# Patient Record
Sex: Male | Born: 1955
Health system: Southern US, Community
[De-identification: ages and names within clinical notes are randomized; demographics above are authoritative.]

## PROBLEM LIST (undated history)

## (undated) DIAGNOSIS — I10 Essential (primary) hypertension: Secondary | ICD-10-CM

## (undated) DIAGNOSIS — Z974 Presence of external hearing-aid: Secondary | ICD-10-CM

## (undated) DIAGNOSIS — C8590 Non-Hodgkin lymphoma, unspecified, unspecified site: Secondary | ICD-10-CM

## (undated) DIAGNOSIS — C801 Malignant (primary) neoplasm, unspecified: Secondary | ICD-10-CM

## (undated) DIAGNOSIS — K219 Gastro-esophageal reflux disease without esophagitis: Secondary | ICD-10-CM

## (undated) DIAGNOSIS — Z87442 Personal history of urinary calculi: Secondary | ICD-10-CM

## (undated) DIAGNOSIS — R7303 Prediabetes: Secondary | ICD-10-CM

## (undated) DIAGNOSIS — G5601 Carpal tunnel syndrome, right upper limb: Secondary | ICD-10-CM

## (undated) DIAGNOSIS — J189 Pneumonia, unspecified organism: Secondary | ICD-10-CM

## (undated) DIAGNOSIS — M1990 Unspecified osteoarthritis, unspecified site: Secondary | ICD-10-CM

## (undated) HISTORY — PX: KNEE ARTHROSCOPY: SUR90

## (undated) HISTORY — PX: EXTERNAL EAR SURGERY: SHX627

## (undated) HISTORY — PX: TESTICLE SURGERY: SHX794

---

## 2007-02-09 ENCOUNTER — Ambulatory Visit: Payer: Self-pay | Admitting: Family Medicine

## 2007-02-14 ENCOUNTER — Ambulatory Visit: Payer: Self-pay | Admitting: Family Medicine

## 2008-01-02 ENCOUNTER — Ambulatory Visit: Payer: Self-pay | Admitting: Family Medicine

## 2011-06-23 ENCOUNTER — Ambulatory Visit: Payer: Self-pay | Admitting: Internal Medicine

## 2011-06-23 DIAGNOSIS — C8590 Non-Hodgkin lymphoma, unspecified, unspecified site: Secondary | ICD-10-CM

## 2011-06-23 HISTORY — DX: Non-Hodgkin lymphoma, unspecified, unspecified site: C85.90

## 2011-07-08 ENCOUNTER — Ambulatory Visit: Payer: Self-pay | Admitting: Urology

## 2011-07-10 LAB — PATHOLOGY REPORT

## 2011-07-22 ENCOUNTER — Ambulatory Visit: Payer: Self-pay | Admitting: Internal Medicine

## 2011-07-24 ENCOUNTER — Ambulatory Visit: Payer: Self-pay | Admitting: Internal Medicine

## 2011-07-29 LAB — RETICULOCYTES: Absolute Retic Count: 0.146 10*6/uL — ABNORMAL HIGH (ref 0.024–0.084)

## 2011-07-29 LAB — CBC CANCER CENTER
Basophil #: 0 x10 3/mm (ref 0.0–0.1)
Basophil %: 0.3 %
Basophil: 1 %
Eosinophil #: 0.1 x10 3/mm (ref 0.0–0.7)
Eosinophil: 1 %
HCT: 45.7 % (ref 40.0–52.0)
HGB: 15.9 g/dL (ref 13.0–18.0)
Lymphocyte #: 1.4 x10 3/mm (ref 1.0–3.6)
Lymphocyte %: 17.7 %
Lymphocytes: 16 %
MCHC: 34.9 g/dL (ref 32.0–36.0)
MCV: 84 fL (ref 80–100)
Monocyte #: 0.5 x10 3/mm (ref 0.0–0.7)
Monocyte %: 6.4 %
Monocytes: 5 %
Neutrophil #: 5.7 x10 3/mm (ref 1.4–6.5)
Platelet: 252 x10 3/mm (ref 150–440)
RDW: 14.3 % (ref 11.5–14.5)

## 2011-07-29 LAB — COMPREHENSIVE METABOLIC PANEL
Alkaline Phosphatase: 112 U/L (ref 50–136)
Anion Gap: 7 (ref 7–16)
BUN: 15 mg/dL (ref 7–18)
Calcium, Total: 9 mg/dL (ref 8.5–10.1)
Chloride: 101 mmol/L (ref 98–107)
Co2: 31 mmol/L (ref 21–32)
Creatinine: 1.11 mg/dL (ref 0.60–1.30)
EGFR (African American): >60
EGFR (Non-African Amer.): >60
Glucose: 103 mg/dL — ABNORMAL HIGH (ref 65–99)
Potassium: 4 mmol/L (ref 3.5–5.1)
SGOT(AST): 31 U/L (ref 15–37)
SGPT (ALT): 57 U/L
Total Protein: 8 g/dL (ref 6.4–8.2)

## 2011-07-29 LAB — LACTATE DEHYDROGENASE: LDH: 193 U/L (ref 87–241)

## 2011-07-30 ENCOUNTER — Ambulatory Visit: Payer: Self-pay | Admitting: Internal Medicine

## 2011-07-30 ENCOUNTER — Ambulatory Visit: Payer: Self-pay

## 2011-08-03 ENCOUNTER — Ambulatory Visit: Payer: Self-pay

## 2011-08-10 ENCOUNTER — Ambulatory Visit: Payer: Self-pay | Admitting: Internal Medicine

## 2011-08-10 LAB — CSF CELL COUNT WITH DIFFERENTIAL
CSF Tube #: 3
Eosinophil: 0 %
Lymphocytes: 60 %
Monocytes/Macrophages: 0 %
Neutrophils: 40 %
RBC (CSF): 85 /mm3
WBC (CSF): 2 /mm3

## 2011-08-10 LAB — GLUCOSE, CSF: Glucose, CSF: 71 mg/dL (ref 40–75)

## 2011-08-13 LAB — BASIC METABOLIC PANEL
Anion Gap: 6 — ABNORMAL LOW (ref 7–16)
Chloride: 102 mmol/L (ref 98–107)
Co2: 32 mmol/L (ref 21–32)
Creatinine: 1.12 mg/dL (ref 0.60–1.30)
EGFR (African American): >60
EGFR (Non-African Amer.): >60
Glucose: 134 mg/dL — ABNORMAL HIGH (ref 65–99)
Osmolality: 282 (ref 275–301)
Potassium: 3.9 mmol/L (ref 3.5–5.1)
Sodium: 140 mmol/L (ref 136–145)

## 2011-08-13 LAB — CBC CANCER CENTER
Basophil #: 0 x10 3/mm (ref 0.0–0.1)
Eosinophil #: 0.1 x10 3/mm (ref 0.0–0.7)
Eosinophil %: 2.2 %
HGB: 15.1 g/dL (ref 13.0–18.0)
Lymphocyte #: 1 x10 3/mm (ref 1.0–3.6)
Lymphocyte %: 19.4 %
MCH: 28 pg (ref 26.0–34.0)
MCHC: 33.5 g/dL (ref 32.0–36.0)
Monocyte #: 0.4 x10 3/mm (ref 0.0–0.7)
Neutrophil %: 70.1 %
Platelet: 177 x10 3/mm (ref 150–440)
RBC: 5.38 10*6/uL (ref 4.40–5.90)
RDW: 13.2 % (ref 11.5–14.5)

## 2011-08-20 LAB — CBC CANCER CENTER
Basophil #: 0 x10 3/mm (ref 0.0–0.1)
Basophil %: 0.6 %
Eosinophil #: 0.1 x10 3/mm (ref 0.0–0.7)
HCT: 43.7 % (ref 40.0–52.0)
HGB: 14.4 g/dL (ref 13.0–18.0)
Lymphocyte #: 0.6 x10 3/mm — ABNORMAL LOW (ref 1.0–3.6)
MCH: 27.7 pg (ref 26.0–34.0)
MCHC: 32.9 g/dL (ref 32.0–36.0)
Monocyte #: 0.2 x10 3/mm (ref 0.0–0.7)
Neutrophil %: 63.4 %
Platelet: 203 x10 3/mm (ref 150–440)
WBC: 2.5 x10 3/mm — ABNORMAL LOW (ref 3.8–10.6)

## 2011-08-21 ENCOUNTER — Ambulatory Visit: Payer: Self-pay | Admitting: Internal Medicine

## 2011-08-27 LAB — URINALYSIS, COMPLETE
Bacteria: NEGATIVE
Glucose,UR: NEGATIVE mg/dL (ref 0–75)
Ketone: NEGATIVE
Nitrite: NEGATIVE
Ph: 8 (ref 4.5–8.0)
Specific Gravity: 1.01 (ref 1.003–1.030)
Squamous Epithelial: NONE SEEN
WBC UR: NONE SEEN /HPF (ref 0–5)

## 2011-08-27 LAB — CBC CANCER CENTER
Basophil %: 0.5 %
Eosinophil #: 0 x10 3/mm (ref 0.0–0.7)
Eosinophil %: 0.2 %
HGB: 14.9 g/dL (ref 13.0–18.0)
Lymphocyte #: 1.2 x10 3/mm (ref 1.0–3.6)
Monocyte #: 0.9 x10 3/mm — ABNORMAL HIGH (ref 0.0–0.7)
Neutrophil #: 10.9 x10 3/mm — ABNORMAL HIGH (ref 1.4–6.5)
Neutrophil %: 83.3 %
RBC: 5.18 10*6/uL (ref 4.40–5.90)
WBC: 13.1 x10 3/mm — ABNORMAL HIGH (ref 3.8–10.6)

## 2011-08-27 LAB — COMPREHENSIVE METABOLIC PANEL
Alkaline Phosphatase: 139 U/L — ABNORMAL HIGH (ref 50–136)
Anion Gap: 5 — ABNORMAL LOW (ref 7–16)
BUN: 15 mg/dL (ref 7–18)
Bilirubin,Total: 0.4 mg/dL (ref 0.2–1.0)
Chloride: 100 mmol/L (ref 98–107)
Co2: 34 mmol/L — ABNORMAL HIGH (ref 21–32)
Creatinine: 1.09 mg/dL (ref 0.60–1.30)
EGFR (African American): >60
EGFR (Non-African Amer.): >60
Potassium: 4 mmol/L (ref 3.5–5.1)
SGPT (ALT): 59 U/L
Total Protein: 8 g/dL (ref 6.4–8.2)

## 2011-09-03 LAB — CBC CANCER CENTER
Basophil #: 0 x10 3/mm (ref 0.0–0.1)
Basophil %: 0.3 %
HCT: 42.1 % (ref 40.0–52.0)
HGB: 14.3 g/dL (ref 13.0–18.0)
Lymphocyte %: 10.9 %
MCH: 28.5 pg (ref 26.0–34.0)
Monocyte #: 0.6 x10 3/mm (ref 0.0–0.7)
Monocyte %: 7.2 %
Neutrophil #: 7.2 x10 3/mm — ABNORMAL HIGH (ref 1.4–6.5)
Neutrophil %: 81.5 %
RBC: 5.01 10*6/uL (ref 4.40–5.90)
RDW: 14.3 % (ref 11.5–14.5)
WBC: 8.8 x10 3/mm (ref 3.8–10.6)

## 2011-09-10 LAB — CBC CANCER CENTER
Basophil #: 0 x10 3/mm (ref 0.0–0.1)
Basophil %: 0.4 %
Eosinophil %: 0.5 %
HCT: 39.6 % — ABNORMAL LOW (ref 40.0–52.0)
HGB: 13.5 g/dL (ref 13.0–18.0)
Lymphocyte #: 0.5 x10 3/mm — ABNORMAL LOW (ref 1.0–3.6)
MCH: 28.1 pg (ref 26.0–34.0)
MCV: 82.3 fL (ref 80–100)
Neutrophil #: 4.2 x10 3/mm (ref 1.4–6.5)
Platelet: 192 x10 3/mm (ref 150–440)
RBC: 4.82 10*6/uL (ref 4.40–5.90)
WBC: 4.8 x10 3/mm (ref 3.8–10.6)

## 2011-09-17 ENCOUNTER — Inpatient Hospital Stay: Payer: Self-pay | Admitting: Internal Medicine

## 2011-09-17 LAB — CBC CANCER CENTER
Basophil #: 0.1 x10 3/mm (ref 0.0–0.1)
Eosinophil #: 0.1 x10 3/mm (ref 0.0–0.7)
Eosinophil %: 0.4 %
HCT: 37.4 % — ABNORMAL LOW (ref 40.0–52.0)
HGB: 13 g/dL (ref 13.0–18.0)
Lymphocyte #: 1.2 x10 3/mm (ref 1.0–3.6)
Lymphocyte %: 9.2 %
MCV: 83.8 fL (ref 80–100)
Monocyte #: 1 x10 3/mm — ABNORMAL HIGH (ref 0.0–0.7)
Monocyte %: 7.9 %
Neutrophil %: 82.1 %
RBC: 4.47 10*6/uL (ref 4.40–5.90)
RDW: 15.5 % — ABNORMAL HIGH (ref 11.5–14.5)
WBC: 12.7 x10 3/mm — ABNORMAL HIGH (ref 3.8–10.6)

## 2011-09-17 LAB — COMPREHENSIVE METABOLIC PANEL
Anion Gap: 6 — ABNORMAL LOW (ref 7–16)
BUN: 16 mg/dL (ref 7–18)
Bilirubin,Total: 0.5 mg/dL (ref 0.2–1.0)
Calcium, Total: 8.8 mg/dL (ref 8.5–10.1)
Co2: 32 mmol/L (ref 21–32)
Creatinine: 1 mg/dL (ref 0.60–1.30)
Glucose: 119 mg/dL — ABNORMAL HIGH (ref 65–99)
Osmolality: 284 (ref 275–301)
Potassium: 4.2 mmol/L (ref 3.5–5.1)
SGOT(AST): 26 U/L (ref 15–37)
SGPT (ALT): 43 U/L
Total Protein: 7.1 g/dL (ref 6.4–8.2)

## 2011-09-17 LAB — URINALYSIS, COMPLETE
Bacteria: NEGATIVE
Bilirubin,UR: NEGATIVE
Glucose,UR: NEGATIVE mg/dL (ref 0–75)
Ketone: NEGATIVE
Protein: NEGATIVE
RBC,UR: NONE SEEN /HPF (ref 0–5)

## 2011-09-18 LAB — CBC WITH DIFFERENTIAL/PLATELET
Basophil #: 0 10*3/uL (ref 0.0–0.1)
Basophil %: 0.2 %
Eosinophil #: 0 10*3/uL (ref 0.0–0.7)
Eosinophil %: 0.4 %
HCT: 35.6 % — ABNORMAL LOW (ref 40.0–52.0)
HGB: 12.1 g/dL — ABNORMAL LOW (ref 13.0–18.0)
Lymphocyte %: 8.5 %
MCHC: 34.1 g/dL (ref 32.0–36.0)
MCV: 85 fL (ref 80–100)
Monocyte #: 0.7 10*3/uL (ref 0.0–0.7)
Neutrophil #: 8.1 10*3/uL — ABNORMAL HIGH (ref 1.4–6.5)
Neutrophil %: 83.8 %
Platelet: 125 10*3/uL — ABNORMAL LOW (ref 150–440)
RBC: 4.21 10*6/uL — ABNORMAL LOW (ref 4.40–5.90)
RDW: 16.6 % — ABNORMAL HIGH (ref 11.5–14.5)
WBC: 9.6 10*3/uL (ref 3.8–10.6)

## 2011-09-18 LAB — URINE PH
Ph: 7 (ref 4.5–8.0)
Ph: 7 (ref 4.5–8.0)

## 2011-09-18 LAB — BASIC METABOLIC PANEL
Anion Gap: 9 (ref 7–16)
EGFR (Non-African Amer.): >60
Glucose: 128 mg/dL — ABNORMAL HIGH (ref 65–99)
Potassium: 4.3 mmol/L (ref 3.5–5.1)
Sodium: 141 mmol/L (ref 136–145)

## 2011-09-19 LAB — BASIC METABOLIC PANEL
BUN: 23 mg/dL — ABNORMAL HIGH (ref 7–18)
Creatinine: 1.3 mg/dL (ref 0.60–1.30)
EGFR (African American): >60
EGFR (Non-African Amer.): >60
Glucose: 178 mg/dL — ABNORMAL HIGH (ref 65–99)
Osmolality: 288 (ref 275–301)
Potassium: 4 mmol/L (ref 3.5–5.1)
Sodium: 140 mmol/L (ref 136–145)

## 2011-09-19 LAB — URINE PH
Ph: 8 (ref 4.5–8.0)
Ph: 8 (ref 4.5–8.0)
Ph: 8 (ref 4.5–8.0)
Ph: 8 (ref 4.5–8.0)
Ph: 7 (ref 4.5–8.0)

## 2011-09-20 LAB — CBC WITH DIFFERENTIAL/PLATELET
Basophil #: 0 10*3/uL (ref 0.0–0.1)
Eosinophil #: 0 10*3/uL (ref 0.0–0.7)
HGB: 11.1 g/dL — ABNORMAL LOW (ref 13.0–18.0)
Lymphocyte #: 0.6 10*3/uL — ABNORMAL LOW (ref 1.0–3.6)
Lymphocyte %: 6.7 %
MCHC: 34.2 g/dL (ref 32.0–36.0)
MCV: 85 fL (ref 80–100)
Monocyte #: 0.8 10*3/uL — ABNORMAL HIGH (ref 0.0–0.7)
Monocyte %: 9 %
Neutrophil #: 7.5 10*3/uL — ABNORMAL HIGH (ref 1.4–6.5)
Neutrophil %: 84.2 %
RBC: 3.84 10*6/uL — ABNORMAL LOW (ref 4.40–5.90)
RDW: 16.4 % — ABNORMAL HIGH (ref 11.5–14.5)
WBC: 8.9 10*3/uL (ref 3.8–10.6)

## 2011-09-20 LAB — URINE PH
Ph: 7 (ref 4.5–8.0)
Ph: 8 (ref 4.5–8.0)
Ph: 9 (ref 4.5–8.0)
Ph: 9 (ref 4.5–8.0)
Ph: 9 (ref 4.5–8.0)
Ph: 8 (ref 4.5–8.0)
Ph: 8 (ref 4.5–8.0)

## 2011-09-20 LAB — BASIC METABOLIC PANEL
Anion Gap: 8 (ref 7–16)
BUN: 29 mg/dL — ABNORMAL HIGH (ref 7–18)
Calcium, Total: 7.6 mg/dL — ABNORMAL LOW (ref 8.5–10.1)
Co2: 33 mmol/L — ABNORMAL HIGH (ref 21–32)
Creatinine: 2.08 mg/dL — ABNORMAL HIGH (ref 0.60–1.30)
EGFR (Non-African Amer.): 35 — ABNORMAL LOW
Glucose: 119 mg/dL — ABNORMAL HIGH (ref 65–99)
Potassium: 4.1 mmol/L (ref 3.5–5.1)

## 2011-09-21 ENCOUNTER — Ambulatory Visit: Payer: Self-pay | Admitting: Internal Medicine

## 2011-09-21 LAB — BASIC METABOLIC PANEL
Anion Gap: 8 (ref 7–16)
BUN: 31 mg/dL — ABNORMAL HIGH (ref 7–18)
Calcium, Total: 7.2 mg/dL — ABNORMAL LOW (ref 8.5–10.1)
Chloride: 100 mmol/L (ref 98–107)
EGFR (Non-African Amer.): 21 — ABNORMAL LOW
Glucose: 135 mg/dL — ABNORMAL HIGH (ref 65–99)
Potassium: 4 mmol/L (ref 3.5–5.1)
Sodium: 141 mmol/L (ref 136–145)

## 2011-09-21 LAB — URINE PH
Ph: 8 (ref 4.5–8.0)
Ph: 8 (ref 4.5–8.0)
Ph: 8 (ref 4.5–8.0)
Ph: 8 (ref 4.5–8.0)
Ph: 8 (ref 4.5–8.0)
Ph: 9 (ref 4.5–8.0)
Ph: 9 (ref 4.5–8.0)
Ph: 9 (ref 4.5–8.0)

## 2011-09-21 LAB — HEPATIC FUNCTION PANEL A (ARMC)
Alkaline Phosphatase: 71 U/L (ref 50–136)
Bilirubin, Direct: 0.1 mg/dL (ref 0.00–0.20)
Bilirubin,Total: 0.5 mg/dL (ref 0.2–1.0)

## 2011-09-21 LAB — LIPASE, BLOOD: Lipase: 63 U/L — ABNORMAL LOW (ref 73–393)

## 2011-09-22 LAB — CBC WITH DIFFERENTIAL/PLATELET
Basophil #: 0 10*3/uL (ref 0.0–0.1)
Basophil %: 0 %
HCT: 27.4 % — ABNORMAL LOW (ref 40.0–52.0)
HGB: 9.4 g/dL — ABNORMAL LOW (ref 13.0–18.0)
Lymphocyte #: 0.5 10*3/uL — ABNORMAL LOW (ref 1.0–3.6)
Lymphocyte %: 2.1 %
MCH: 29.4 pg (ref 26.0–34.0)
Monocyte #: 0.1 10*3/uL (ref 0.0–0.7)
Neutrophil #: 25.5 10*3/uL — ABNORMAL HIGH (ref 1.4–6.5)
Platelet: 96 10*3/uL — ABNORMAL LOW (ref 150–440)
RDW: 16.6 % — ABNORMAL HIGH (ref 11.5–14.5)
WBC: 26.1 10*3/uL — ABNORMAL HIGH (ref 3.8–10.6)

## 2011-09-22 LAB — BASIC METABOLIC PANEL
Calcium, Total: 7.4 mg/dL — ABNORMAL LOW (ref 8.5–10.1)
Co2: 35 mmol/L — ABNORMAL HIGH (ref 21–32)
EGFR (Non-African Amer.): 17 — ABNORMAL LOW
Glucose: 139 mg/dL — ABNORMAL HIGH (ref 65–99)
Osmolality: 292 (ref 275–301)
Potassium: 3.5 mmol/L (ref 3.5–5.1)

## 2011-09-22 LAB — URINE PH
Ph: 9 (ref 4.5–8.0)
Ph: 9 (ref 4.5–8.0)
Ph: 9 (ref 4.5–8.0)
Ph: 9 (ref 4.5–8.0)
Ph: 9 (ref 4.5–8.0)
Ph: 9 (ref 4.5–8.0)
Ph: 9 (ref 4.5–8.0)
Ph: 9 (ref 4.5–8.0)

## 2011-09-23 LAB — BASIC METABOLIC PANEL
Anion Gap: 10 (ref 7–16)
BUN: 39 mg/dL — ABNORMAL HIGH (ref 7–18)
Calcium, Total: 7.4 mg/dL — ABNORMAL LOW (ref 8.5–10.1)
Chloride: 95 mmol/L — ABNORMAL LOW (ref 98–107)
Co2: 35 mmol/L — ABNORMAL HIGH (ref 21–32)
Creatinine: 4.5 mg/dL — ABNORMAL HIGH (ref 0.60–1.30)
EGFR (Non-African Amer.): 15 — ABNORMAL LOW
Glucose: 141 mg/dL — ABNORMAL HIGH (ref 65–99)
Potassium: 3.3 mmol/L — ABNORMAL LOW (ref 3.5–5.1)
Sodium: 140 mmol/L (ref 136–145)

## 2011-09-23 LAB — URINE PH
Ph: 9 (ref 4.5–8.0)
Ph: 9 (ref 4.5–8.0)
Ph: 9 (ref 4.5–8.0)
Ph: 9 (ref 4.5–8.0)
Ph: 9 (ref 4.5–8.0)
Ph: 9 (ref 4.5–8.0)
Ph: 9 (ref 4.5–8.0)

## 2011-09-24 LAB — BASIC METABOLIC PANEL
Anion Gap: 7 (ref 7–16)
BUN: 42 mg/dL — ABNORMAL HIGH (ref 7–18)
Chloride: 95 mmol/L — ABNORMAL LOW (ref 98–107)
Creatinine: 4.79 mg/dL — ABNORMAL HIGH (ref 0.60–1.30)
EGFR (African American): 16 — ABNORMAL LOW
Glucose: 129 mg/dL — ABNORMAL HIGH (ref 65–99)
Osmolality: 290 (ref 275–301)

## 2011-09-24 LAB — CBC WITH DIFFERENTIAL/PLATELET
Basophil #: 0 10*3/uL (ref 0.0–0.1)
Basophil %: 0.1 %
Eosinophil #: 0 10*3/uL (ref 0.0–0.7)
Eosinophil %: 0.2 %
Lymphocyte #: 0.3 10*3/uL — ABNORMAL LOW (ref 1.0–3.6)
Lymphocyte %: 1.9 %
MCHC: 34.6 g/dL (ref 32.0–36.0)
MCV: 85 fL (ref 80–100)
Neutrophil #: 14.5 10*3/uL — ABNORMAL HIGH (ref 1.4–6.5)
Neutrophil %: 97.5 %
RBC: 3.03 10*6/uL — ABNORMAL LOW (ref 4.40–5.90)
RDW: 16.7 % — ABNORMAL HIGH (ref 11.5–14.5)
WBC: 14.9 10*3/uL — ABNORMAL HIGH (ref 3.8–10.6)

## 2011-09-24 LAB — URINE PH
Ph: 9 (ref 4.5–8.0)
Ph: 9 (ref 4.5–8.0)
Ph: 9 (ref 4.5–8.0)
Ph: 9 (ref 4.5–8.0)
Ph: 9 (ref 4.5–8.0)

## 2011-09-25 LAB — BASIC METABOLIC PANEL
Anion Gap: 9 (ref 7–16)
Chloride: 100 mmol/L (ref 98–107)
Co2: 34 mmol/L — ABNORMAL HIGH (ref 21–32)
EGFR (Non-African Amer.): 15 — ABNORMAL LOW
Osmolality: 295 (ref 275–301)
Potassium: 3.2 mmol/L — ABNORMAL LOW (ref 3.5–5.1)

## 2011-09-25 LAB — URINE PH
Ph: 9 (ref 4.5–8.0)
Ph: 9 (ref 4.5–8.0)
Ph: 9 (ref 4.5–8.0)
Ph: 9 (ref 4.5–8.0)
Ph: 9 (ref 4.5–8.0)

## 2011-09-25 LAB — MAGNESIUM: Magnesium: 2.3 mg/dL

## 2011-09-25 LAB — HEPATIC FUNCTION PANEL A (ARMC)
Bilirubin, Direct: 0.2 mg/dL (ref 0.00–0.20)
Bilirubin,Total: 0.8 mg/dL (ref 0.2–1.0)
SGPT (ALT): 34 U/L

## 2011-09-25 LAB — PHOSPHORUS: Phosphorus: 4.5 mg/dL (ref 2.5–4.9)

## 2011-09-26 LAB — CBC WITH DIFFERENTIAL/PLATELET
Basophil #: 0 10*3/uL (ref 0.0–0.1)
Eosinophil #: 0.1 10*3/uL (ref 0.0–0.7)
Eosinophil %: 1.6 %
HGB: 8.5 g/dL — ABNORMAL LOW (ref 13.0–18.0)
Lymphocyte #: 0.4 10*3/uL — ABNORMAL LOW (ref 1.0–3.6)
Lymphocyte %: 9.2 %
MCH: 29.7 pg (ref 26.0–34.0)
MCHC: 35 g/dL (ref 32.0–36.0)
Monocyte #: 0.3 10*3/uL (ref 0.0–0.7)
Neutrophil #: 3.1 10*3/uL (ref 1.4–6.5)
Platelet: 124 10*3/uL — ABNORMAL LOW (ref 150–440)
RBC: 2.88 10*6/uL — ABNORMAL LOW (ref 4.40–5.90)
RDW: 16.6 % — ABNORMAL HIGH (ref 11.5–14.5)
WBC: 3.9 10*3/uL (ref 3.8–10.6)

## 2011-09-26 LAB — BASIC METABOLIC PANEL
Anion Gap: 8 (ref 7–16)
BUN: 38 mg/dL — ABNORMAL HIGH (ref 7–18)
Chloride: 104 mmol/L (ref 98–107)
Glucose: 110 mg/dL — ABNORMAL HIGH (ref 65–99)
Osmolality: 297 (ref 275–301)
Potassium: 3.6 mmol/L (ref 3.5–5.1)
Sodium: 144 mmol/L (ref 136–145)

## 2011-09-26 LAB — URINE PH
Ph: 9 (ref 4.5–8.0)
Ph: 9 (ref 4.5–8.0)

## 2011-09-27 LAB — URINE PH
Ph: 9 (ref 4.5–8.0)
Ph: 9 (ref 4.5–8.0)

## 2011-09-27 LAB — BASIC METABOLIC PANEL
Calcium, Total: 8.2 mg/dL — ABNORMAL LOW (ref 8.5–10.1)
Chloride: 103 mmol/L (ref 98–107)
Creatinine: 3.49 mg/dL — ABNORMAL HIGH (ref 0.60–1.30)
Glucose: 109 mg/dL — ABNORMAL HIGH (ref 65–99)
Potassium: 3.6 mmol/L (ref 3.5–5.1)

## 2011-09-28 LAB — HEPATIC FUNCTION PANEL A (ARMC)
Alkaline Phosphatase: 97 U/L (ref 50–136)
Bilirubin, Direct: 0.2 mg/dL (ref 0.00–0.20)
Bilirubin,Total: 0.7 mg/dL (ref 0.2–1.0)
SGOT(AST): 61 U/L — ABNORMAL HIGH (ref 15–37)
SGPT (ALT): 81 U/L — ABNORMAL HIGH
Total Protein: 6.2 g/dL — ABNORMAL LOW (ref 6.4–8.2)

## 2011-09-28 LAB — BASIC METABOLIC PANEL
Anion Gap: 8 (ref 7–16)
BUN: 30 mg/dL — ABNORMAL HIGH (ref 7–18)
Calcium, Total: 8.2 mg/dL — ABNORMAL LOW (ref 8.5–10.1)
Chloride: 104 mmol/L (ref 98–107)
Co2: 30 mmol/L (ref 21–32)
Creatinine: 3.19 mg/dL — ABNORMAL HIGH (ref 0.60–1.30)
Glucose: 112 mg/dL — ABNORMAL HIGH (ref 65–99)

## 2011-09-28 LAB — URINE PH
Ph: 9 (ref 4.5–8.0)
Ph: 9 (ref 4.5–8.0)
Ph: 9 (ref 4.5–8.0)

## 2011-09-28 LAB — CBC WITH DIFFERENTIAL/PLATELET
Eosinophil #: 0.1 10*3/uL (ref 0.0–0.7)
Eosinophil %: 1.6 %
HGB: 8.1 g/dL — ABNORMAL LOW (ref 13.0–18.0)
Lymphocyte %: 10.3 %
MCH: 29.7 pg (ref 26.0–34.0)
MCHC: 35 g/dL (ref 32.0–36.0)
MCV: 85 fL (ref 80–100)
Monocyte #: 0.4 10*3/uL (ref 0.0–0.7)
Monocyte %: 8.9 %
Platelet: 139 10*3/uL — ABNORMAL LOW (ref 150–440)
RDW: 17 % — ABNORMAL HIGH (ref 11.5–14.5)
WBC: 4.1 10*3/uL (ref 3.8–10.6)

## 2011-09-29 LAB — BASIC METABOLIC PANEL
BUN: 30 mg/dL — ABNORMAL HIGH (ref 7–18)
Calcium, Total: 8.8 mg/dL (ref 8.5–10.1)
Chloride: 102 mmol/L (ref 98–107)
Creatinine: 3.1 mg/dL — ABNORMAL HIGH (ref 0.60–1.30)
EGFR (African American): 27 — ABNORMAL LOW
EGFR (Non-African Amer.): 22 — ABNORMAL LOW
Glucose: 105 mg/dL — ABNORMAL HIGH (ref 65–99)
Osmolality: 286 (ref 275–301)
Potassium: 3.4 mmol/L — ABNORMAL LOW (ref 3.5–5.1)

## 2011-09-30 LAB — BASIC METABOLIC PANEL
Anion Gap: 10 (ref 7–16)
Calcium, Total: 9.1 mg/dL (ref 8.5–10.1)
Chloride: 102 mmol/L (ref 98–107)
Creatinine: 2.93 mg/dL — ABNORMAL HIGH (ref 0.60–1.30)
EGFR (Non-African Amer.): 24 — ABNORMAL LOW
Sodium: 138 mmol/L (ref 136–145)

## 2011-10-01 LAB — BASIC METABOLIC PANEL
BUN: 33 mg/dL — ABNORMAL HIGH (ref 7–18)
Co2: 27 mmol/L (ref 21–32)
Creatinine: 2.58 mg/dL — ABNORMAL HIGH (ref 0.60–1.30)
EGFR (African American): 31 — ABNORMAL LOW
Glucose: 109 mg/dL — ABNORMAL HIGH (ref 65–99)
Osmolality: 282 (ref 275–301)
Potassium: 3.8 mmol/L (ref 3.5–5.1)

## 2011-10-01 LAB — HEPATIC FUNCTION PANEL A (ARMC)
Albumin: 4.1 g/dL (ref 3.4–5.0)
Bilirubin, Direct: 0.2 mg/dL (ref 0.00–0.20)
Bilirubin,Total: 0.8 mg/dL (ref 0.2–1.0)
SGOT(AST): 32 U/L (ref 15–37)
Total Protein: 8.1 g/dL (ref 6.4–8.2)

## 2011-10-01 LAB — CBC CANCER CENTER
Lymphocyte #: 0.8 x10 3/mm — ABNORMAL LOW (ref 1.0–3.6)
Lymphocyte %: 15.9 %
MCHC: 34.8 g/dL (ref 32.0–36.0)
MCV: 84 fL (ref 80–100)
Monocyte %: 13.7 %
Neutrophil #: 3.1 x10 3/mm (ref 1.4–6.5)
Neutrophil %: 66.5 %
RBC: 3.75 10*6/uL — ABNORMAL LOW (ref 4.40–5.90)
WBC: 4.7 x10 3/mm (ref 3.8–10.6)

## 2011-10-05 LAB — BASIC METABOLIC PANEL
Anion Gap: 10 (ref 7–16)
BUN: 30 mg/dL — ABNORMAL HIGH (ref 7–18)
Creatinine: 2.26 mg/dL — ABNORMAL HIGH (ref 0.60–1.30)
EGFR (Non-African Amer.): 31 — ABNORMAL LOW
Osmolality: 285 (ref 275–301)
Sodium: 139 mmol/L (ref 136–145)

## 2011-10-08 LAB — BASIC METABOLIC PANEL
Anion Gap: 8 (ref 7–16)
BUN: 25 mg/dL — ABNORMAL HIGH (ref 7–18)
Calcium, Total: 8.9 mg/dL (ref 8.5–10.1)
Chloride: 99 mmol/L (ref 98–107)
EGFR (Non-African Amer.): 40 — ABNORMAL LOW
Potassium: 3.7 mmol/L (ref 3.5–5.1)
Sodium: 137 mmol/L (ref 136–145)

## 2011-10-08 LAB — CBC CANCER CENTER
Basophil #: 0.1 x10 3/mm (ref 0.0–0.1)
Basophil %: 1 %
Eosinophil #: 0.2 x10 3/mm (ref 0.0–0.7)
Eosinophil %: 3.2 %
HCT: 32.2 % — ABNORMAL LOW (ref 40.0–52.0)
HGB: 11.4 g/dL — ABNORMAL LOW (ref 13.0–18.0)
Lymphocyte #: 0.9 x10 3/mm — ABNORMAL LOW (ref 1.0–3.6)
Lymphocyte %: 12.6 %
MCHC: 35.3 g/dL (ref 32.0–36.0)
MCV: 84 fL (ref 80–100)
Neutrophil #: 5.1 x10 3/mm (ref 1.4–6.5)
Platelet: 364 x10 3/mm (ref 150–440)
RBC: 3.85 10*6/uL — ABNORMAL LOW (ref 4.40–5.90)
RDW: 17.6 % — ABNORMAL HIGH (ref 11.5–14.5)
WBC: 7 x10 3/mm (ref 3.8–10.6)

## 2011-10-08 LAB — HEPATIC FUNCTION PANEL A (ARMC)
Albumin: 3.9 g/dL (ref 3.4–5.0)
Alkaline Phosphatase: 120 U/L (ref 50–136)
Bilirubin, Direct: 0.1 mg/dL (ref 0.00–0.20)
Bilirubin,Total: 0.5 mg/dL (ref 0.2–1.0)
SGOT(AST): 21 U/L (ref 15–37)
Total Protein: 7.6 g/dL (ref 6.4–8.2)

## 2011-10-12 LAB — BASIC METABOLIC PANEL
BUN: 20 mg/dL — ABNORMAL HIGH (ref 7–18)
Calcium, Total: 9.2 mg/dL (ref 8.5–10.1)
Co2: 28 mmol/L (ref 21–32)
Creatinine: 1.58 mg/dL — ABNORMAL HIGH (ref 0.60–1.30)
EGFR (African American): 56 — ABNORMAL LOW
EGFR (Non-African Amer.): 49 — ABNORMAL LOW
Osmolality: 272 (ref 275–301)
Potassium: 3.8 mmol/L (ref 3.5–5.1)

## 2011-10-15 LAB — BASIC METABOLIC PANEL
Chloride: 101 mmol/L (ref 98–107)
Co2: 29 mmol/L (ref 21–32)
Creatinine: 1.48 mg/dL — ABNORMAL HIGH (ref 0.60–1.30)
EGFR (African American): >60
EGFR (Non-African Amer.): 53 — ABNORMAL LOW
Glucose: 138 mg/dL — ABNORMAL HIGH (ref 65–99)
Potassium: 4 mmol/L (ref 3.5–5.1)
Sodium: 137 mmol/L (ref 136–145)

## 2011-10-19 LAB — CBC CANCER CENTER
Basophil %: 1.1 %
Eosinophil #: 0.2 x10 3/mm (ref 0.0–0.7)
Eosinophil %: 2.5 %
HGB: 12 g/dL — ABNORMAL LOW (ref 13.0–18.0)
MCH: 28.9 pg (ref 26.0–34.0)
Monocyte %: 8.3 %
Neutrophil #: 6.8 x10 3/mm — ABNORMAL HIGH (ref 1.4–6.5)
Neutrophil %: 78.4 %
RBC: 4.16 10*6/uL — ABNORMAL LOW (ref 4.40–5.90)
RDW: 18.2 % — ABNORMAL HIGH (ref 11.5–14.5)
WBC: 8.6 x10 3/mm (ref 3.8–10.6)

## 2011-10-19 LAB — BASIC METABOLIC PANEL
Anion Gap: 10 (ref 7–16)
Calcium, Total: 10 mg/dL (ref 8.5–10.1)
Co2: 29 mmol/L (ref 21–32)
Creatinine: 1.2 mg/dL (ref 0.60–1.30)
EGFR (African American): >60
Potassium: 4.2 mmol/L (ref 3.5–5.1)

## 2011-10-20 ENCOUNTER — Ambulatory Visit: Payer: Self-pay | Admitting: Internal Medicine

## 2011-10-20 LAB — CSF CELL CT + PROT + GLU PANEL
Eosinophil: 0 %
Monocytes/Macrophages: 0 %
Neutrophils: 0 %
WBC (CSF): 0 /mm3

## 2011-10-21 ENCOUNTER — Ambulatory Visit: Payer: Self-pay | Admitting: Internal Medicine

## 2011-10-22 LAB — BASIC METABOLIC PANEL
Anion Gap: 8 (ref 7–16)
Calcium, Total: 8.6 mg/dL (ref 8.5–10.1)
Chloride: 102 mmol/L (ref 98–107)
Co2: 30 mmol/L (ref 21–32)
Creatinine: 1.16 mg/dL (ref 0.60–1.30)
Glucose: 111 mg/dL — ABNORMAL HIGH (ref 65–99)
Osmolality: 284 (ref 275–301)
Potassium: 3.2 mmol/L — ABNORMAL LOW (ref 3.5–5.1)
Sodium: 140 mmol/L (ref 136–145)

## 2011-10-22 LAB — CBC CANCER CENTER
Basophil %: 0.1 %
Eosinophil #: 0 x10 3/mm (ref 0.0–0.7)
HCT: 32 % — ABNORMAL LOW (ref 40.0–52.0)
HGB: 10.9 g/dL — ABNORMAL LOW (ref 13.0–18.0)
Lymphocyte #: 0.9 x10 3/mm — ABNORMAL LOW (ref 1.0–3.6)
MCV: 86 fL (ref 80–100)
Monocyte #: 0.6 x10 3/mm (ref 0.2–1.0)
Monocyte %: 2.1 %
Neutrophil #: 29.4 x10 3/mm — ABNORMAL HIGH (ref 1.4–6.5)
Platelet: 131 x10 3/mm — ABNORMAL LOW (ref 150–440)
RBC: 3.72 10*6/uL — ABNORMAL LOW (ref 4.40–5.90)
WBC: 30.9 x10 3/mm — ABNORMAL HIGH (ref 3.8–10.6)

## 2011-10-29 LAB — CBC CANCER CENTER
Basophil %: 0.8 %
Eosinophil #: 0 x10 3/mm (ref 0.0–0.7)
HGB: 11.4 g/dL — ABNORMAL LOW (ref 13.0–18.0)
Lymphocyte %: 10.3 %
MCH: 29.6 pg (ref 26.0–34.0)
Monocyte #: 0.5 x10 3/mm (ref 0.2–1.0)
Monocyte %: 9.2 %
Neutrophil #: 4.6 x10 3/mm (ref 1.4–6.5)
Neutrophil %: 79.3 %
Platelet: 152 x10 3/mm (ref 150–440)
RDW: 17.2 % — ABNORMAL HIGH (ref 11.5–14.5)

## 2011-10-29 LAB — BASIC METABOLIC PANEL
Anion Gap: 10 (ref 7–16)
BUN: 13 mg/dL (ref 7–18)
Chloride: 100 mmol/L (ref 98–107)
Co2: 29 mmol/L (ref 21–32)
Osmolality: 278 (ref 275–301)
Potassium: 4.1 mmol/L (ref 3.5–5.1)

## 2011-11-05 LAB — CBC CANCER CENTER
Basophil #: 0.1 x10 3/mm (ref 0.0–0.1)
Eosinophil #: 0 x10 3/mm (ref 0.0–0.7)
HCT: 33.2 % — ABNORMAL LOW (ref 40.0–52.0)
Lymphocyte #: 0.8 x10 3/mm — ABNORMAL LOW (ref 1.0–3.6)
Lymphocyte %: 10.2 %
MCH: 30.2 pg (ref 26.0–34.0)
MCV: 86 fL (ref 80–100)
Monocyte #: 0.6 x10 3/mm (ref 0.2–1.0)
Monocyte %: 8.3 %
Neutrophil #: 5.9 x10 3/mm (ref 1.4–6.5)
Neutrophil %: 80 %
Platelet: 203 x10 3/mm (ref 150–440)
RDW: 17.9 % — ABNORMAL HIGH (ref 11.5–14.5)
WBC: 7.4 x10 3/mm (ref 3.8–10.6)

## 2011-11-05 LAB — BASIC METABOLIC PANEL
BUN: 15 mg/dL (ref 7–18)
Chloride: 101 mmol/L (ref 98–107)
Glucose: 125 mg/dL — ABNORMAL HIGH (ref 65–99)
Osmolality: 278 (ref 275–301)
Potassium: 3.8 mmol/L (ref 3.5–5.1)

## 2011-11-09 LAB — CBC CANCER CENTER
Basophil %: 1 %
HCT: 33.8 % — ABNORMAL LOW (ref 40.0–52.0)
HGB: 11.7 g/dL — ABNORMAL LOW (ref 13.0–18.0)
Lymphocyte #: 0.7 x10 3/mm — ABNORMAL LOW (ref 1.0–3.6)
Lymphocyte %: 9.7 %
MCHC: 34.8 g/dL (ref 32.0–36.0)
MCV: 86 fL (ref 80–100)
Monocyte #: 0.7 x10 3/mm (ref 0.2–1.0)
Monocyte %: 9.4 %
Neutrophil %: 79.7 %
RBC: 3.91 10*6/uL — ABNORMAL LOW (ref 4.40–5.90)
WBC: 7 x10 3/mm (ref 3.8–10.6)

## 2011-11-09 LAB — HEPATIC FUNCTION PANEL A (ARMC)
Albumin: 4 g/dL (ref 3.4–5.0)
Alkaline Phosphatase: 81 U/L (ref 50–136)
Bilirubin, Direct: 0.1 mg/dL (ref 0.00–0.20)
SGOT(AST): 22 U/L (ref 15–37)
SGPT (ALT): 33 U/L
Total Protein: 7.7 g/dL (ref 6.4–8.2)

## 2011-11-09 LAB — BASIC METABOLIC PANEL
Anion Gap: 11 (ref 7–16)
BUN: 17 mg/dL (ref 7–18)
Calcium, Total: 8.8 mg/dL (ref 8.5–10.1)
Co2: 27 mmol/L (ref 21–32)
Creatinine: 1.02 mg/dL (ref 0.60–1.30)
EGFR (African American): >60
Osmolality: 282 (ref 275–301)
Potassium: 3.9 mmol/L (ref 3.5–5.1)

## 2011-11-11 ENCOUNTER — Ambulatory Visit: Payer: Self-pay | Admitting: Internal Medicine

## 2011-11-11 ENCOUNTER — Ambulatory Visit: Payer: Self-pay | Admitting: Vascular Surgery

## 2011-11-12 LAB — CBC CANCER CENTER
Basophil #: 0.1 x10 3/mm (ref 0.0–0.1)
Basophil %: 0.7 %
Eosinophil #: 0 x10 3/mm (ref 0.0–0.7)
Eosinophil %: 0.6 %
HCT: 33.6 % — ABNORMAL LOW (ref 40.0–52.0)
HGB: 11.8 g/dL — ABNORMAL LOW (ref 13.0–18.0)
Lymphocyte %: 8.8 %
MCHC: 35.1 g/dL (ref 32.0–36.0)
Monocyte #: 0.7 x10 3/mm (ref 0.2–1.0)
Neutrophil #: 6.3 x10 3/mm (ref 1.4–6.5)
Neutrophil %: 80.8 %
WBC: 7.8 x10 3/mm (ref 3.8–10.6)

## 2011-11-12 LAB — BASIC METABOLIC PANEL
Anion Gap: 7 (ref 7–16)
BUN: 17 mg/dL (ref 7–18)
Calcium, Total: 9 mg/dL (ref 8.5–10.1)
Co2: 29 mmol/L (ref 21–32)
EGFR (Non-African Amer.): >60
Osmolality: 280 (ref 275–301)
Potassium: 4.3 mmol/L (ref 3.5–5.1)
Sodium: 139 mmol/L (ref 136–145)

## 2011-11-19 LAB — BASIC METABOLIC PANEL
Anion Gap: 8 (ref 7–16)
BUN: 18 mg/dL (ref 7–18)
Co2: 30 mmol/L (ref 21–32)
Creatinine: 1 mg/dL (ref 0.60–1.30)
Glucose: 127 mg/dL — ABNORMAL HIGH (ref 65–99)

## 2011-11-19 LAB — CBC CANCER CENTER
Basophil #: 0.1 x10 3/mm (ref 0.0–0.1)
Basophil %: 2.4 %
Eosinophil #: 0.1 x10 3/mm (ref 0.0–0.7)
HGB: 10.6 g/dL — ABNORMAL LOW (ref 13.0–18.0)
Lymphocyte #: 0.4 x10 3/mm — ABNORMAL LOW (ref 1.0–3.6)
Lymphocyte %: 14.7 %
MCH: 30.4 pg (ref 26.0–34.0)
MCV: 87 fL (ref 80–100)
Monocyte #: 0.1 x10 3/mm — ABNORMAL LOW (ref 0.2–1.0)
Monocyte %: 5.1 %
Neutrophil %: 75.4 %
Platelet: 171 x10 3/mm (ref 150–440)
RBC: 3.49 10*6/uL — ABNORMAL LOW (ref 4.40–5.90)
RDW: 16.8 % — ABNORMAL HIGH (ref 11.5–14.5)

## 2011-11-21 ENCOUNTER — Ambulatory Visit: Payer: Self-pay | Admitting: Internal Medicine

## 2011-11-26 LAB — CBC CANCER CENTER
Basophil #: 0.1 x10 3/mm (ref 0.0–0.1)
Basophil %: 0.7 %
Eosinophil %: 0.2 %
HCT: 32.8 % — ABNORMAL LOW (ref 40.0–52.0)
HGB: 11.3 g/dL — ABNORMAL LOW (ref 13.0–18.0)
Lymphocyte %: 8.4 %
MCHC: 34.6 g/dL (ref 32.0–36.0)
MCV: 88 fL (ref 80–100)
Monocyte #: 0.8 x10 3/mm (ref 0.2–1.0)
Monocyte %: 9.8 %
Neutrophil #: 6.2 x10 3/mm (ref 1.4–6.5)
Neutrophil %: 80.9 %
Platelet: 133 x10 3/mm — ABNORMAL LOW (ref 150–440)
RBC: 3.72 10*6/uL — ABNORMAL LOW (ref 4.40–5.90)
RDW: 17.1 % — ABNORMAL HIGH (ref 11.5–14.5)

## 2011-11-26 LAB — HEPATIC FUNCTION PANEL A (ARMC)
Alkaline Phosphatase: 117 U/L (ref 50–136)
Bilirubin, Direct: 0.1 mg/dL (ref 0.00–0.20)
Bilirubin,Total: 0.5 mg/dL (ref 0.2–1.0)
SGOT(AST): 17 U/L (ref 15–37)
SGPT (ALT): 35 U/L
Total Protein: 7.2 g/dL (ref 6.4–8.2)

## 2011-11-26 LAB — BASIC METABOLIC PANEL
Calcium, Total: 8.9 mg/dL (ref 8.5–10.1)
Creatinine: 1.26 mg/dL (ref 0.60–1.30)
EGFR (Non-African Amer.): >60
Glucose: 142 mg/dL — ABNORMAL HIGH (ref 65–99)
Osmolality: 279 (ref 275–301)
Potassium: 4.1 mmol/L (ref 3.5–5.1)
Sodium: 137 mmol/L (ref 136–145)

## 2011-12-02 ENCOUNTER — Ambulatory Visit: Payer: Self-pay | Admitting: Internal Medicine

## 2011-12-03 LAB — CBC CANCER CENTER
Basophil #: 0 x10 3/mm (ref 0.0–0.1)
Eosinophil #: 0 x10 3/mm (ref 0.0–0.7)
HCT: 32.3 % — ABNORMAL LOW (ref 40.0–52.0)
Lymphocyte #: 0.6 x10 3/mm — ABNORMAL LOW (ref 1.0–3.6)
Lymphocyte %: 9.3 %
MCHC: 34.7 g/dL (ref 32.0–36.0)
MCV: 88 fL (ref 80–100)
Neutrophil %: 81.1 %
Platelet: 227 x10 3/mm (ref 150–440)
RDW: 17.1 % — ABNORMAL HIGH (ref 11.5–14.5)

## 2011-12-03 LAB — BASIC METABOLIC PANEL
BUN: 13 mg/dL (ref 7–18)
Calcium, Total: 8.7 mg/dL (ref 8.5–10.1)
Chloride: 104 mmol/L (ref 98–107)
Co2: 27 mmol/L (ref 21–32)
Creatinine: 1.06 mg/dL (ref 0.60–1.30)
EGFR (Non-African Amer.): >60
Osmolality: 288 (ref 275–301)
Potassium: 3.8 mmol/L (ref 3.5–5.1)

## 2011-12-10 LAB — CBC CANCER CENTER
Basophil %: 0.6 %
Eosinophil #: 0 x10 3/mm (ref 0.0–0.7)
Eosinophil %: 1.2 %
HCT: 29.4 % — ABNORMAL LOW (ref 40.0–52.0)
Lymphocyte %: 16.3 %
MCH: 30.6 pg (ref 26.0–34.0)
MCHC: 35 g/dL (ref 32.0–36.0)
MCV: 88 fL (ref 80–100)
Monocyte %: 7.2 %
Neutrophil #: 1.5 x10 3/mm (ref 1.4–6.5)
Platelet: 152 x10 3/mm (ref 150–440)
RBC: 3.36 10*6/uL — ABNORMAL LOW (ref 4.40–5.90)
WBC: 2 x10 3/mm — CL (ref 3.8–10.6)

## 2011-12-10 LAB — BASIC METABOLIC PANEL
Chloride: 102 mmol/L (ref 98–107)
Co2: 29 mmol/L (ref 21–32)
Creatinine: 0.93 mg/dL (ref 0.60–1.30)
EGFR (African American): >60
Osmolality: 281 (ref 275–301)
Potassium: 3.7 mmol/L (ref 3.5–5.1)

## 2011-12-17 LAB — HEPATIC FUNCTION PANEL A (ARMC)
Albumin: 3.8 g/dL (ref 3.4–5.0)
Alkaline Phosphatase: 123 U/L (ref 50–136)
Bilirubin, Direct: 0.1 mg/dL (ref 0.00–0.20)
Bilirubin,Total: 0.5 mg/dL (ref 0.2–1.0)
SGOT(AST): 20 U/L (ref 15–37)
Total Protein: 7.4 g/dL (ref 6.4–8.2)

## 2011-12-17 LAB — CBC CANCER CENTER
HGB: 11.6 g/dL — ABNORMAL LOW (ref 13.0–18.0)
Lymphocyte #: 0.6 x10 3/mm — ABNORMAL LOW (ref 1.0–3.6)
MCHC: 34.2 g/dL (ref 32.0–36.0)
Monocyte #: 0.7 x10 3/mm (ref 0.2–1.0)
Platelet: 155 x10 3/mm (ref 150–440)

## 2011-12-17 LAB — BASIC METABOLIC PANEL
Anion Gap: 6 — ABNORMAL LOW (ref 7–16)
BUN: 17 mg/dL (ref 7–18)
Chloride: 100 mmol/L (ref 98–107)
Co2: 30 mmol/L (ref 21–32)
EGFR (Non-African Amer.): >60
Glucose: 151 mg/dL — ABNORMAL HIGH (ref 65–99)
Osmolality: 276 (ref 275–301)
Potassium: 4.4 mmol/L (ref 3.5–5.1)
Sodium: 136 mmol/L (ref 136–145)

## 2011-12-21 ENCOUNTER — Ambulatory Visit: Payer: Self-pay | Admitting: Internal Medicine

## 2011-12-28 ENCOUNTER — Ambulatory Visit: Payer: Self-pay | Admitting: Internal Medicine

## 2011-12-29 LAB — CBC CANCER CENTER
Basophil #: 0.1 x10 3/mm (ref 0.0–0.1)
HGB: 11.5 g/dL — ABNORMAL LOW (ref 13.0–18.0)
Lymphocyte #: 0.6 x10 3/mm — ABNORMAL LOW (ref 1.0–3.6)
MCH: 28.9 pg (ref 26.0–34.0)
MCHC: 33.4 g/dL (ref 32.0–36.0)
MCV: 87 fL (ref 80–100)
Monocyte %: 10.9 %
Neutrophil #: 5.8 x10 3/mm (ref 1.4–6.5)
Neutrophil %: 78.9 %
RDW: 16.5 % — ABNORMAL HIGH (ref 11.5–14.5)
WBC: 7.4 x10 3/mm (ref 3.8–10.6)

## 2011-12-29 LAB — CREATININE, SERUM
EGFR (African American): >60
EGFR (Non-African Amer.): >60

## 2012-01-07 LAB — CBC CANCER CENTER
Basophil #: 0 x10 3/mm (ref 0.0–0.1)
Basophil %: 1.8 %
Eosinophil #: 0 x10 3/mm (ref 0.0–0.7)
HCT: 32.8 % — ABNORMAL LOW (ref 40.0–52.0)
HGB: 11.4 g/dL — ABNORMAL LOW (ref 13.0–18.0)
Lymphocyte #: 0.5 x10 3/mm — ABNORMAL LOW (ref 1.0–3.6)
Lymphocyte %: 20.2 %
MCHC: 34.6 g/dL (ref 32.0–36.0)
Monocyte #: 0.3 x10 3/mm (ref 0.2–1.0)
Neutrophil #: 1.4 x10 3/mm (ref 1.4–6.5)
Neutrophil %: 61.7 %
Platelet: 176 x10 3/mm (ref 150–440)
RBC: 3.8 10*6/uL — ABNORMAL LOW (ref 4.40–5.90)
WBC: 2.3 x10 3/mm — ABNORMAL LOW (ref 3.8–10.6)

## 2012-01-12 LAB — COMPREHENSIVE METABOLIC PANEL
Albumin: 3.9 g/dL (ref 3.4–5.0)
Alkaline Phosphatase: 122 U/L (ref 50–136)
Anion Gap: 11 (ref 7–16)
BUN: 28 mg/dL — ABNORMAL HIGH (ref 7–18)
Calcium, Total: 8.9 mg/dL (ref 8.5–10.1)
Creatinine: 1.17 mg/dL (ref 0.60–1.30)
Glucose: 114 mg/dL — ABNORMAL HIGH (ref 65–99)
Osmolality: 288 (ref 275–301)
Potassium: 4.2 mmol/L (ref 3.5–5.1)
SGPT (ALT): 39 U/L
Sodium: 141 mmol/L (ref 136–145)
Total Protein: 7.7 g/dL (ref 6.4–8.2)

## 2012-01-12 LAB — CBC CANCER CENTER
Basophil #: 0.1 x10 3/mm (ref 0.0–0.1)
Basophil %: 0.7 %
HCT: 35.4 % — ABNORMAL LOW (ref 40.0–52.0)
HGB: 11.9 g/dL — ABNORMAL LOW (ref 13.0–18.0)
Lymphocyte %: 5.7 %
Monocyte %: 8 %
Neutrophil #: 7.3 x10 3/mm — ABNORMAL HIGH (ref 1.4–6.5)
RBC: 4.08 10*6/uL — ABNORMAL LOW (ref 4.40–5.90)
RDW: 17.2 % — ABNORMAL HIGH (ref 11.5–14.5)

## 2012-01-21 ENCOUNTER — Ambulatory Visit: Payer: Self-pay | Admitting: Internal Medicine

## 2012-01-30 ENCOUNTER — Ambulatory Visit: Payer: Self-pay | Admitting: Physician Assistant

## 2012-02-01 ENCOUNTER — Ambulatory Visit: Payer: Self-pay | Admitting: Physician Assistant

## 2012-02-03 LAB — CBC CANCER CENTER
Basophil %: 0.9 %
Eosinophil %: 2.9 %
HGB: 12.4 g/dL — ABNORMAL LOW (ref 13.0–18.0)
Lymphocyte #: 0.6 x10 3/mm — ABNORMAL LOW (ref 1.0–3.6)
MCH: 28.7 pg (ref 26.0–34.0)
MCV: 86 fL (ref 80–100)
Monocyte #: 0.5 x10 3/mm (ref 0.2–1.0)
Monocyte %: 10.6 %
Neutrophil %: 73 %
Platelet: 217 x10 3/mm (ref 150–440)
RBC: 4.32 10*6/uL — ABNORMAL LOW (ref 4.40–5.90)

## 2012-02-10 LAB — CBC CANCER CENTER
Basophil #: 0 x10 3/mm (ref 0.0–0.1)
Lymphocyte #: 0.5 x10 3/mm — ABNORMAL LOW (ref 1.0–3.6)
Lymphocyte %: 12.9 %
MCV: 84 fL (ref 80–100)
Monocyte #: 0.4 x10 3/mm (ref 0.2–1.0)
Monocyte %: 11.6 %
Neutrophil %: 70.6 %
Platelet: 199 x10 3/mm (ref 150–440)
RDW: 15.5 % — ABNORMAL HIGH (ref 11.5–14.5)
WBC: 3.7 x10 3/mm — ABNORMAL LOW (ref 3.8–10.6)

## 2012-02-17 LAB — CBC CANCER CENTER
Basophil #: 0.1 x10 3/mm (ref 0.0–0.1)
Eosinophil #: 0.1 x10 3/mm (ref 0.0–0.7)
Eosinophil %: 4.7 %
Lymphocyte #: 0.6 x10 3/mm — ABNORMAL LOW (ref 1.0–3.6)
MCH: 28 pg (ref 26.0–34.0)
MCHC: 33.2 g/dL (ref 32.0–36.0)
Monocyte #: 0.5 x10 3/mm (ref 0.2–1.0)
Monocyte %: 15.5 %
Neutrophil %: 56.2 %
Platelet: 211 x10 3/mm (ref 150–440)
RBC: 4.67 10*6/uL (ref 4.40–5.90)
WBC: 2.9 x10 3/mm — ABNORMAL LOW (ref 3.8–10.6)

## 2012-02-21 ENCOUNTER — Ambulatory Visit: Payer: Self-pay | Admitting: Internal Medicine

## 2012-03-24 ENCOUNTER — Ambulatory Visit: Payer: Self-pay | Admitting: Internal Medicine

## 2012-03-31 LAB — CBC CANCER CENTER
Basophil #: 0 x10 3/mm (ref 0.0–0.1)
Eosinophil #: 0.1 x10 3/mm (ref 0.0–0.7)
Lymphocyte %: 10.2 %
MCHC: 34.3 g/dL (ref 32.0–36.0)
Monocyte #: 0.7 x10 3/mm (ref 0.2–1.0)
Monocyte %: 9.2 %
Neutrophil #: 5.7 x10 3/mm (ref 1.4–6.5)
Neutrophil %: 78.2 %
Platelet: 203 x10 3/mm (ref 150–440)
RDW: 15.4 % — ABNORMAL HIGH (ref 11.5–14.5)
WBC: 7.3 x10 3/mm (ref 3.8–10.6)

## 2012-03-31 LAB — HEPATIC FUNCTION PANEL A (ARMC)
Albumin: 3.6 g/dL (ref 3.4–5.0)
Alkaline Phosphatase: 105 U/L (ref 50–136)
Bilirubin,Total: 0.5 mg/dL (ref 0.2–1.0)
SGOT(AST): 19 U/L (ref 15–37)
SGPT (ALT): 38 U/L (ref 12–78)
Total Protein: 7.3 g/dL (ref 6.4–8.2)

## 2012-03-31 LAB — CREATININE, SERUM
Creatinine: 1 mg/dL (ref 0.60–1.30)
EGFR (Non-African Amer.): >60

## 2012-03-31 LAB — LACTATE DEHYDROGENASE: LDH: 180 U/L (ref 85–241)

## 2012-04-22 ENCOUNTER — Ambulatory Visit: Payer: Self-pay | Admitting: Internal Medicine

## 2012-05-22 ENCOUNTER — Ambulatory Visit: Payer: Self-pay | Admitting: Internal Medicine

## 2012-06-07 ENCOUNTER — Ambulatory Visit: Payer: Self-pay | Admitting: Family Medicine

## 2012-06-16 LAB — CBC CANCER CENTER
HCT: 42.2 % (ref 40.0–52.0)
Lymphocyte #: 0.7 x10 3/mm — ABNORMAL LOW (ref 1.0–3.6)
Lymphocyte %: 13.2 %
MCH: 27.8 pg (ref 26.0–34.0)
MCHC: 34.7 g/dL (ref 32.0–36.0)
MCV: 80 fL (ref 80–100)
Monocyte #: 0.4 x10 3/mm (ref 0.2–1.0)
Neutrophil #: 4 x10 3/mm (ref 1.4–6.5)
Platelet: 179 x10 3/mm (ref 150–440)
RBC: 5.28 10*6/uL (ref 4.40–5.90)
RDW: 16.1 % — ABNORMAL HIGH (ref 11.5–14.5)
WBC: 5.2 x10 3/mm (ref 3.8–10.6)

## 2012-06-16 LAB — CREATININE, SERUM
Creatinine: 1.11 mg/dL (ref 0.60–1.30)
EGFR (Non-African Amer.): >60

## 2012-06-16 LAB — HEPATIC FUNCTION PANEL A (ARMC)
Albumin: 3.9 g/dL (ref 3.4–5.0)
SGOT(AST): 19 U/L (ref 15–37)
SGPT (ALT): 38 U/L (ref 12–78)
Total Protein: 7.6 g/dL (ref 6.4–8.2)

## 2012-06-22 ENCOUNTER — Ambulatory Visit: Payer: Self-pay | Admitting: Internal Medicine

## 2012-06-23 ENCOUNTER — Ambulatory Visit: Payer: Self-pay | Admitting: Internal Medicine

## 2012-07-02 ENCOUNTER — Ambulatory Visit: Payer: Self-pay | Admitting: Emergency Medicine

## 2012-07-28 ENCOUNTER — Ambulatory Visit: Payer: Self-pay | Admitting: Internal Medicine

## 2012-08-20 ENCOUNTER — Ambulatory Visit: Payer: Self-pay | Admitting: Internal Medicine

## 2012-09-15 LAB — CBC CANCER CENTER
Basophil #: 0 x10 3/mm (ref 0.0–0.1)
Basophil %: 0.6 %
Eosinophil #: 0.1 x10 3/mm (ref 0.0–0.7)
HCT: 41.6 % (ref 40.0–52.0)
MCH: 27.4 pg (ref 26.0–34.0)
MCHC: 34.1 g/dL (ref 32.0–36.0)
Monocyte #: 0.4 x10 3/mm (ref 0.2–1.0)
Monocyte %: 6.5 %
Neutrophil #: 4.1 x10 3/mm (ref 1.4–6.5)
Neutrophil %: 74.4 %
RBC: 5.17 10*6/uL (ref 4.40–5.90)

## 2012-09-15 LAB — HEPATIC FUNCTION PANEL A (ARMC)
Albumin: 3.8 g/dL (ref 3.4–5.0)
Bilirubin, Direct: 0.1 mg/dL (ref 0.00–0.20)
Bilirubin,Total: 0.5 mg/dL (ref 0.2–1.0)
SGOT(AST): 35 U/L (ref 15–37)

## 2012-09-15 LAB — CREATININE, SERUM
EGFR (African American): >60
EGFR (Non-African Amer.): >60

## 2012-09-15 LAB — LACTATE DEHYDROGENASE: LDH: 189 U/L (ref 85–241)

## 2012-09-20 ENCOUNTER — Ambulatory Visit: Payer: Self-pay | Admitting: Internal Medicine

## 2012-12-08 ENCOUNTER — Ambulatory Visit: Payer: Self-pay | Admitting: Internal Medicine

## 2012-12-20 ENCOUNTER — Ambulatory Visit: Payer: Self-pay | Admitting: Internal Medicine

## 2013-03-16 ENCOUNTER — Ambulatory Visit: Payer: Self-pay | Admitting: Oncology

## 2013-03-16 LAB — CBC CANCER CENTER
Basophil #: 0.1 x10 3/mm (ref 0.0–0.1)
Eosinophil #: 0.1 x10 3/mm (ref 0.0–0.7)
Eosinophil %: 2.4 %
Lymphocyte #: 0.9 x10 3/mm — ABNORMAL LOW (ref 1.0–3.6)
Lymphocyte %: 14.1 %
MCH: 27.9 pg (ref 26.0–34.0)
MCV: 81 fL (ref 80–100)
Monocyte %: 8.4 %
Neutrophil %: 74.3 %
Platelet: 190 x10 3/mm (ref 150–440)
RBC: 5.54 10*6/uL (ref 4.40–5.90)

## 2013-03-16 LAB — CREATININE, SERUM: EGFR (African American): >60

## 2013-03-16 LAB — HEPATIC FUNCTION PANEL A (ARMC)
Albumin: 3.7 g/dL (ref 3.4–5.0)
Alkaline Phosphatase: 124 U/L (ref 50–136)
Bilirubin,Total: 0.9 mg/dL (ref 0.2–1.0)
SGPT (ALT): 38 U/L (ref 12–78)
Total Protein: 7.6 g/dL (ref 6.4–8.2)

## 2013-03-22 ENCOUNTER — Ambulatory Visit: Payer: Self-pay | Admitting: Oncology

## 2013-03-23 LAB — CBC CANCER CENTER
Basophil #: 0 x10 3/mm (ref 0.0–0.1)
Basophil %: 0.7 %
Eosinophil #: 0.1 x10 3/mm (ref 0.0–0.7)
Eosinophil %: 1.6 %
HCT: 43 % (ref 40.0–52.0)
HGB: 14.9 g/dL (ref 13.0–18.0)
Lymphocyte #: 1 x10 3/mm (ref 1.0–3.6)
MCH: 28.4 pg (ref 26.0–34.0)
MCHC: 34.6 g/dL (ref 32.0–36.0)
Monocyte #: 0.5 x10 3/mm (ref 0.2–1.0)
Neutrophil #: 4.5 x10 3/mm (ref 1.4–6.5)

## 2013-03-23 LAB — COMPREHENSIVE METABOLIC PANEL
Anion Gap: 7 (ref 7–16)
Bilirubin,Total: 0.7 mg/dL (ref 0.2–1.0)
Calcium, Total: 8.3 mg/dL — ABNORMAL LOW (ref 8.5–10.1)
Co2: 29 mmol/L (ref 21–32)
Creatinine: 1.24 mg/dL (ref 0.60–1.30)
EGFR (African American): >60
EGFR (Non-African Amer.): >60
Osmolality: 279 (ref 275–301)
Potassium: 4.1 mmol/L (ref 3.5–5.1)
SGOT(AST): 23 U/L (ref 15–37)
SGPT (ALT): 37 U/L (ref 12–78)
Sodium: 137 mmol/L (ref 136–145)

## 2013-03-23 LAB — PROTIME-INR: Prothrombin Time: 13.1 secs (ref 11.5–14.7)

## 2013-03-29 ENCOUNTER — Ambulatory Visit: Payer: Self-pay

## 2013-04-22 ENCOUNTER — Ambulatory Visit: Payer: Self-pay | Admitting: Oncology

## 2013-07-10 ENCOUNTER — Ambulatory Visit: Payer: Self-pay | Admitting: Oncology

## 2013-07-10 ENCOUNTER — Ambulatory Visit: Payer: Self-pay | Admitting: Internal Medicine

## 2013-07-10 LAB — HEPATIC FUNCTION PANEL A (ARMC)
ALBUMIN: 3.8 g/dL (ref 3.4–5.0)
ALK PHOS: 105 U/L
AST: 22 U/L (ref 15–37)
Bilirubin, Direct: 0.2 mg/dL (ref 0.00–0.20)
Bilirubin,Total: 0.5 mg/dL (ref 0.2–1.0)
SGPT (ALT): 38 U/L (ref 12–78)
TOTAL PROTEIN: 7.4 g/dL (ref 6.4–8.2)

## 2013-07-10 LAB — CBC CANCER CENTER
Basophil #: 0.1 x10 3/mm (ref 0.0–0.1)
Basophil %: 0.9 %
Eosinophil #: 0.1 x10 3/mm (ref 0.0–0.7)
Eosinophil %: 1.2 %
HCT: 43.4 % (ref 40.0–52.0)
HGB: 14.8 g/dL (ref 13.0–18.0)
Lymphocyte #: 1.2 x10 3/mm (ref 1.0–3.6)
Lymphocyte %: 15.4 %
MCH: 28.3 pg (ref 26.0–34.0)
MCHC: 34.2 g/dL (ref 32.0–36.0)
MCV: 83 fL (ref 80–100)
Monocyte #: 0.7 x10 3/mm (ref 0.2–1.0)
Monocyte %: 8.3 %
Neutrophil #: 6 x10 3/mm (ref 1.4–6.5)
Neutrophil %: 74.2 %
Platelet: 195 x10 3/mm (ref 150–440)
RBC: 5.24 10*6/uL (ref 4.40–5.90)
RDW: 15.5 % — AB (ref 11.5–14.5)
WBC: 8 x10 3/mm (ref 3.8–10.6)

## 2013-07-10 LAB — CREATININE, SERUM
CREATININE: 1.03 mg/dL (ref 0.60–1.30)
EGFR (African American): >60
EGFR (Non-African Amer.): >60

## 2013-07-10 LAB — LACTATE DEHYDROGENASE: LDH: 193 U/L (ref 85–241)

## 2013-07-23 ENCOUNTER — Ambulatory Visit: Payer: Self-pay | Admitting: Oncology

## 2013-11-24 ENCOUNTER — Ambulatory Visit: Payer: Self-pay | Admitting: Internal Medicine

## 2014-01-25 ENCOUNTER — Ambulatory Visit: Payer: Self-pay | Admitting: Internal Medicine

## 2014-01-31 ENCOUNTER — Ambulatory Visit: Payer: Self-pay | Admitting: Internal Medicine

## 2014-01-31 LAB — CBC CANCER CENTER
BASOS PCT: 0.6 %
Basophil #: 0 x10 3/mm (ref 0.0–0.1)
EOS PCT: 2.3 %
Eosinophil #: 0.1 x10 3/mm (ref 0.0–0.7)
HCT: 45 % (ref 40.0–52.0)
HGB: 15.5 g/dL (ref 13.0–18.0)
LYMPHS ABS: 1.2 x10 3/mm (ref 1.0–3.6)
Lymphocyte %: 19.9 %
MCH: 28.5 pg (ref 26.0–34.0)
MCHC: 34.5 g/dL (ref 32.0–36.0)
MCV: 83 fL (ref 80–100)
Monocyte #: 0.5 x10 3/mm (ref 0.2–1.0)
Monocyte %: 8.6 %
NEUTROS ABS: 4.2 x10 3/mm (ref 1.4–6.5)
NEUTROS PCT: 68.6 %
Platelet: 216 x10 3/mm (ref 150–440)
RBC: 5.44 10*6/uL (ref 4.40–5.90)
RDW: 14.6 % — ABNORMAL HIGH (ref 11.5–14.5)
WBC: 6.1 x10 3/mm (ref 3.8–10.6)

## 2014-01-31 LAB — HEPATIC FUNCTION PANEL A (ARMC)
ALK PHOS: 105 U/L
ALT: 30 U/L
Albumin: 3.8 g/dL (ref 3.4–5.0)
BILIRUBIN DIRECT: 0.2 mg/dL (ref 0.00–0.20)
Bilirubin,Total: 0.6 mg/dL (ref 0.2–1.0)
SGOT(AST): 18 U/L (ref 15–37)
Total Protein: 7.6 g/dL (ref 6.4–8.2)

## 2014-01-31 LAB — LACTATE DEHYDROGENASE: LDH: 178 U/L (ref 85–241)

## 2014-02-20 ENCOUNTER — Ambulatory Visit: Payer: Self-pay | Admitting: Internal Medicine

## 2014-07-26 ENCOUNTER — Ambulatory Visit: Payer: Self-pay | Admitting: Internal Medicine

## 2014-07-26 LAB — CBC CANCER CENTER
BASOS PCT: 0.7 %
Basophil #: 0 x10 3/mm (ref 0.0–0.1)
EOS ABS: 0.2 x10 3/mm (ref 0.0–0.7)
Eosinophil %: 2.2 %
HCT: 46.1 % (ref 40.0–52.0)
HGB: 16 g/dL (ref 13.0–18.0)
Lymphocyte #: 1.4 x10 3/mm (ref 1.0–3.6)
Lymphocyte %: 19.2 %
MCH: 28.3 pg (ref 26.0–34.0)
MCHC: 34.7 g/dL (ref 32.0–36.0)
MCV: 82 fL (ref 80–100)
MONO ABS: 0.5 x10 3/mm (ref 0.2–1.0)
Monocyte %: 6.7 %
NEUTROS ABS: 5.1 x10 3/mm (ref 1.4–6.5)
NEUTROS PCT: 71.2 %
Platelet: 208 x10 3/mm (ref 150–440)
RBC: 5.65 10*6/uL (ref 4.40–5.90)
RDW: 14.7 % — ABNORMAL HIGH (ref 11.5–14.5)
WBC: 7.1 x10 3/mm (ref 3.8–10.6)

## 2014-07-26 LAB — HEPATIC FUNCTION PANEL A (ARMC)
AST: 23 U/L (ref 15–37)
Albumin: 3.9 g/dL (ref 3.4–5.0)
Alkaline Phosphatase: 103 U/L (ref 46–116)
Bilirubin, Direct: 0.1 mg/dL (ref 0.0–0.2)
Bilirubin,Total: 0.4 mg/dL (ref 0.2–1.0)
SGPT (ALT): 41 U/L (ref 14–63)
Total Protein: 7.7 g/dL (ref 6.4–8.2)

## 2014-07-26 LAB — CREATININE, SERUM
CREATININE: 1.16 mg/dL (ref 0.60–1.30)
EGFR (African American): >60
EGFR (Non-African Amer.): >60

## 2014-07-26 LAB — LACTATE DEHYDROGENASE: LDH: 179 U/L (ref 85–241)

## 2014-08-10 ENCOUNTER — Emergency Department: Payer: Self-pay | Admitting: Emergency Medicine

## 2014-08-21 ENCOUNTER — Ambulatory Visit
Admit: 2014-08-21 | Disposition: A | Discharge status: Home / Self Care | Payer: Self-pay | Attending: Internal Medicine | Admitting: Internal Medicine

## 2014-10-05 ENCOUNTER — Other Ambulatory Visit: Payer: Self-pay | Admitting: Internal Medicine

## 2014-10-05 DIAGNOSIS — C8298 Follicular lymphoma, unspecified, lymph nodes of multiple sites: Secondary | ICD-10-CM

## 2014-10-09 NOTE — Consult Note (Signed)
Reason for Visit: This 59 year old Male Nathan Gill presents to the clinic for initial evaluation of  Testicular lymphoma .   Referred by Dr. Ma Hillock.  Diagnosis:   Chief Complaint/Diagnosis   59 year old male with stage I he diffuse B-cell lymphoma CD20 positive status post left radical orchiectomy and adjuvant chemotherapy including intrathecal methotrexate   Pathology Report Pathology report reviewed    Imaging Report PET/CT scan reviewed    Referral Report Clinical notes reviewed    Planned Treatment Regimen Adjuvant radiation therapy to scrotum    HPI   Nathan Gill is a 59 year old male initially presented with enlarging left testis. He been amount of biker and noticed over the course of the year increasing swollen and tender scrotum. He was seen by Dr. Ernst Spell underwent a radical orchiectomy on 07/08/11. Tumor was diffuse B-cell lymphoma CD20 positive. Bone marrow was negative. PET scan showed no evidence of disease elsewhere in his body and he was staged 1 AE. He has undergone 6 cycles of R. CHOP. He also had 1 dose of IV methotrexate which was constipated by 2 renal failure which cleared. He is also been treated with intrathecal methotrexate for CNS prophylaxis. He is completed all chemotherapy and is doing well. He specifically denies fever chills night sweats. No other areas of adenopathy are noted. He is back to fairly normal activity. He is seen today for consideration of involved field radiation to his scrotum.  Past Hx:    Cancer--Testicular:    HOH--Left Ear Hearing Aid:    Hypertension:    Right knee Arthroscopy:   Past, Family and Social History:   Past Medical History positive    Cardiovascular hypertension    Gastrointestinal GERD    Genitourinary Acute renal failure secondary to methotrexate chemotherapy.    Neurological/Psychiatric Some hearing loss.    Past Surgical History Right knee arthroscopic    Family History positive    Family History Comments Family  history positive for hypertension and skin cancer no other evidence of family history of cancer    Social History noncontributory    Additional Past Medical and Surgical History Seen by himself today   Allergies:   No Known Allergies:   Home Meds:  Home Medications: Medication Instructions Status  amLODIPine 10 mg tablet 1 tab(s) orally once a day Active  ondansetron 4 mg tablet 1 tab(s) orally every 8 hours, As Needed- for Nausea, Vomiting  Active  promethazine 25 mg tablet 1 tab(s) orally every 8 hours, As Needed- for Nausea, Vomiting  Active  PriLOSEC OTC 20 mg oral delayed release tablet 1 tab(s) orally once a day Active  MMW 5 milliliter(s) orally 4 times a day, As Needed for mouth sores Active  Carafate 1 g/10 mL oral suspension 10 milliliter(s) orally 4 times a day (before meals and at bedtime) Active   Review of Systems:   General negative    Performance Status (ECOG) 0    Skin negative    Breast negative    Ophthalmologic negative    ENMT negative    Respiratory and Thorax negative    Cardiovascular negative    Gastrointestinal negative    Genitourinary see HPI    Musculoskeletal negative    Neurological negative    Psychiatric negative    Hematology/Lymphatics negative    Endocrine negative    Allergic/Immunologic negative   Physical Exam:  General/Skin/HEENT:   General normal    Skin normal    Eyes normal    ENMT normal  Head and Neck normal    Additional PE Well-developed slightly obese male in NAD. Lungs are clear to A&P cardiac examination shows regular rate and rhythm. Abdomen is benign. No evidence of peripheral adenopathy is identified. Examination of the scrotum should this shows absence of his left testis. Right testis is no evidence of firmness mass or nodularity. Anal region is benign bilaterally.   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 CHOP plus intrathecal methotrexate  Plan:   after reviewing the literature current recommendations for stage I testicular lymphoma is to treat the contralateral testis and as well as the surgical bed of the involved testis. Since this is stage I disease and no evidence of other disease on PET/CT do not feel need to incorporate his para-aortic lymph nodes or pelvic lymph nodes. I would treat to 3000 cGy over 3 weeks to his entire scrotum. Risks and benefits of treatment were reviewed with the Nathan Gill and he seems to [place] treatment plan well. Major side effects will be skin irritation and erythematous changes of the skin in the region of treatment and nursing instruction was given. I have set him up for CT simulation early next week.  I would like to take this opportunity to thank you for allowing me to continue to participate in this Nathan Gill's care.  CC Referral:   cc: Dr. Otilio Miu   Electronic Signatures: Baruch Gouty, Roda Shutters (MD)  (Signed 23-Jul-13 11:26)  Authored: HPI, Diagnosis, Past Hx, PFSH, Allergies, Home Meds, ROS, Physical Exam, Encounter Assessment and Plan, CC Referring Physician   Last Updated: 23-Jul-13 11:26 by Armstead Peaks (MD)

## 2014-10-12 NOTE — Op Note (Signed)
PATIENT NAME:  Nathan Gill, Nathan Gill MR#:  [id number] DATE OF BIRTH:  June 25, 1955  DATE OF PROCEDURE:  03/29/2013  PREOPERATIVE DIAGNOSIS: Painful Port-A-Cath.   POSTOPERATIVE DIAGNOSIS:  Painful Port-A-Cath  OPERATION PERFORMED: Removal of Port-A-Cath.   SURGEON: Louis Matte, M.D.   ASSISTANT:  Olegario Shearer, PA-S   INDICATIONS FOR PROCEDURE: Mr. [name] is a 59 year old gentleman with a history of lymphoma who required a Port-A-Cath for chemotherapy. He is now disease-free and the Port-A-Cath has been painful upon movement of his right upper extremity. He desires that to be removed.  The indications and risks were explained to patient who gave his informed consent.   DESCRIPTION OF PROCEDURE: The patient was brought to the operating suite and placed in the supine position. Intravenous sedation as well as 20 mL of 0.25% Marcaine were used for anesthesia. The patient was prepped and draped in the usual sterile fashion. A previous incision was opened, and using electrocautery, these tissues were divided down to the Port-A-Cath. The  anchoring stitches on the Port-A-Cath were removed and the catheter was removed from its pseudocapsule. The pseudocapsule was then removed using electrocautery. Hemostasis was complete. The deeper tissues were closed with interrupted Vicryl sutures. The skin was closed with nylon. Sterile dressings were applied. The patient tolerated the procedure well and was taken back to the recovery room in stable condition.    ____________________________ Lew Dawes Genevive Bi, MD teo:dp D: 03/29/2013 08:13:15 ET T: 03/29/2013 08:20:16 ET JOB#: [id number]  cc: Christia Reading E. Genevive Bi, MD, <Dictator> Louis Matte MD ELECTRONICALLY SIGNED 04/05/2013 12:51

## 2014-10-14 NOTE — Op Note (Signed)
PATIENT NAME:  Nathan Gill, Nathan Gill MR#:  [id number] DATE OF BIRTH:  Jun 03, 1956  DATE OF PROCEDURE:  07/08/2011  PREOPERATIVE DIAGNOSIS: Neoplasm of the left testis.   POSTOPERATIVE DIAGNOSIS: Neoplasm of the left testis.   PROCEDURE: Radical left orchiectomy with frozen section.   SURGEON: Alcus Dad, MD  ASSISTANT: Raiford Noble, MD  ANESTHESIA: General.  INDICATIONS: This patient presented with a swollen left testis of five weeks duration. He had sustained minimal trauma and thought that was cause of the problem. Two weeks later minor trauma occurred again, but no severe pain. He gradually has become more uncomfortable and the mass in the left testis has become more pronounced and larger. After evaluation with ultrasound and examination he was scheduled for radical left orchiectomy for a probable neoplasm of the left testis.   DESCRIPTION OF PROCEDURE: In the supine position under general anesthesia, the lower abdomen and genital area was prepped and draped for surgery. An incision was made in the left inguinal area and carried down through skin and subcutaneous tissue to the structures of the cord. The external oblique aponeurosis had been separated because it was there were two rather large lipomas of the cord. A search was made for a hernia sac which was not present. Satinsky clamps were placed on the vascular structures, at the internal ring, and the testis was dissected free from surrounding tissues and delivered onto the surgical field. Drapes were used to isolate the testis from the rest of the surgical field and a biopsy was taken and submitted for frozen section. The frozen section confirmed a malignancy, type not determined.   The vascular structures were ligated at the internal ring using 3-0 silk. Metal clips were placed on the vascular structures for marking in the event of future therapy. The vas was ligated separately from surrounding vascular structures using a metal clip. A search  was made for bleeders which were cauterized or ligated. The external oblique aponeurosis was reapproximated Mersilene sutures. 4-0 Vicryl was used approximate the skin. A dry sterile dressing was applied. The patient tolerated the procedure well and returned to the recovery area in satisfactory condition. ____________________________ Alcus Dad, MD jsh:slb D: 07/08/2011 14:46:23 ET T: 07/08/2011 15:05:17 ET JOB#: [id number]  cc: Alcus Dad, MD, <Dictator> Alcus Dad MD ELECTRONICALLY SIGNED 07/23/2011 9:50

## 2014-10-14 NOTE — Discharge Summary (Signed)
PATIENT NAME:  Nathan Gill, Nathan Gill MR#:  [id number] DATE OF BIRTH:  Feb 19, 1956  DATE OF ADMISSION:  09/17/2011 DATE OF DISCHARGE:  09/28/2011  DISCHARGE DIAGNOSES:  1. Acute renal failure likely from ATN from methotrexate toxicity-  improving.  2. History of testicular lymphoma. He received high-dose methotrexate chemotherapy on 09/18/2011.  3. Hypertension.   HISTORY OF PRESENT ILLNESS: The patient is a 59 year old gentleman with known history of testicular lymphoma status post left radical orchiectomy in January 2013, on Rituxan and CHOP chemotherapy. The patient was electively admitted to the hospital on 03/28 for high-dose IV methotrexate chemotherapy for CNS prophylaxis. The patient was clinically doing well at the time of admission. No nausea, vomiting, or diarrhea. No fevers or chills. Denied any headache, visual disturbances, or focal neurological weakness.  He was eating well.   For past medical history, surgical history, allergies, home medications, review of systems, physical examination, please refer to the History and Physical note for details.   HOSPITAL COURSE: Labs on admission showed creatinine normal at 1, hemoglobin 13, platelets 132, WBC 12,700 with 72% neutrophils, potassium 4.2, calcium 8, and liver functions unremarkable. The patient was started on prehydration with D5 half-normal saline with potassium and sodium bicarbonate at 150 mL/h. On 03/29 the patient was seen by Dr. Delana Meyer and had removal of existing right subclavian Port-A-Cath and insertion of right IJ Infuse-a-Port. Urine pH was consistently maintained at greater than 7 and was monitored frequently, almost once every two hours during hospitalization. The patient received high-dose IV methotrexate 3.5 g/sq m (7 grams total dose) on 03/29. By 09/20/2011, creatinine had increased to 2.08. He had good urine output. He was feeling tired with intermittent nausea. Nephrology was consulted for acute renal failure and recommendations  were followed. It was felt that the acute renal failure was likely secondary to ATN from methotrexate toxicity. Methotrexate levels were monitored. (It was 3.5 at 24 hours, 1.75 at 48 hours, and 1.08 at 72 hours.) By day five methotrexate level did not improve further and was persistently elevated at 1.18. Creatinine had further increased up to 4.79. Given persistent serum methotrexate level greater than 1 along with delayed methotrexate clearance and acute renal failure, and also after discussing the case with Dr. Seward Speck at [facility], who reportedly discussed with the clinical pharmacist and suggested giving treatment with glucarpidase to clear methotrexate from his system. Accordingly the patient received glucarpidase 6000 units times one dose IV (leucovorin dose was avoided for two hours before and two hours after giving this medication). The patient was continued on IV hydration along with alkalinization with sodium bicarbonate. Urine pH consistently maintained at greater than 7. Subsequent to this, creatinine began to show slow but consistent improvement and it was down to 3.82 on  04/06, at 3.49 on 04/07, and 3.19 on 04/08. Hemoglobin was 8.1 on 04/08 likely secondary to methotrexate-induced myelosuppression. The patient, however, was clinically feeling much better and he did not feel like he needed blood transfusion support. The patient also did not want to continue to stay in the hospital since he felt that his oral fluid and food intake was excellent. He was therefore discharged home with the plan to monitor daily metabolic panel for the next three days and follow up at [facility] on 04/11 with CBC and metabolic panel. Repeat serum methotrexate level the night of April 05 had already improved to 0.29, and another methotrexate level from April 08 was pending at the time of discharge and will be  followed up. In between visits, the patient was advised to call or come to the ER in case of any  recurrent symptoms or sickness. He was agreeable to this plan.   DISCHARGE MEDICATIONS:  1. Amlodipine 10 mg orally once daily.  2. Carafate 1 gram q.i.d. p.r.n. for pain on swallowing. 3. Pepcid 20 mg q. a.m.  4. Zofran 4 mg q. 8 hours p.r.n.  5. Phenergan 25 mg q. 8 hours p.r.n.   DIET: Renal diet.   ACTIVITY: As tolerated.   FOLLOWUP: Follow up at the [facility] on 04/09 and 04/10 for labs (MET-B). Follow up with Dr. Leia Alf at the [facility] with labs on 04/11.     ____________________________ Rhett Bannister. Ma Hillock, MD srp:bjt D: 10/06/2011 09:31:23 ET T: 10/06/2011 10:36:25 ET JOB#: [id number]  cc: Yochanan Eddleman R. Ma Hillock, MD, <Dictator> Alveta Heimlich MD ELECTRONICALLY SIGNED 10/08/2011 10:01

## 2014-10-14 NOTE — Op Note (Signed)
PATIENT NAME:  Nathan Gill, GERHOLD MR#:  [id number] DATE OF BIRTH:  22-Mar-1956  DATE OF PROCEDURE:  11/11/2011  PREOPERATIVE DIAGNOSIS: Complication of vascular device.   POSTOPERATIVE DIAGNOSIS: Complication of vascular device.  PROCEDURE PERFORMED: Revision right IJ Infuse-a-Port.   SURGEON: Katha Cabal MD   SEDATION: Versed plus fentanyl. Continuous ECG, pulse oximetry, and cardiopulmonary monitoring was performed throughout the entire procedure by the interventional radiology nurse. Total sedation time is 45 minutes.   CONTRAST: None.   FLUORO TIME: Approximately 0.5 minutes.   INDICATIONS: [name] presents with inversion of his ports. The port can no longer be accessed. It is flipped over in the pocket and he is undergoing revision to treat this.   PROCEDURE: The patient is brought to Special Procedures and placed in the supine position. After adequate sedation is achieved, the area surrounding the port and the previous incisional scar is prepped and draped in a sterile fashion. It is infiltrated with 1% lidocaine with epinephrine. The incision is then opened with a scalpel, dissection carried down, the port is exposed, and the port is returned back to an appropriate anatomic orientation. It is then secured to the fascia with 0 Prolene. Three out of the four corner sutures are used to secure the port. In other words, three 0 Prolene sutures are now used to secure the port in the proper upright configuration. The fourth port is sutured with a Vicryl suture. The port is then accessed. It aspirates and flushes well. On fluoroscopy it is well positioned and there are no kinks. The tip is at the atriocaval junction.     The pocket is then irrigated with gentamicin solution and closed in layers using interrupted 3-0 Vicryl followed by 4-0 Monocryl subcuticular and Dermabond. The patient tolerated the procedure well and there were no immediate complications.     ____________________________ Katha Cabal, MD ggs:drc D: 11/12/2011 08:57:08 ET T: 11/12/2011 13:20:02 ET JOB#: [id number]  cc: Katha Cabal, MD, <Dictator> Katha Cabal MD ELECTRONICALLY SIGNED 11/13/2011 10:56

## 2014-10-14 NOTE — Op Note (Signed)
PATIENT NAME:  Nathan Gill, Nathan Gill MR#:  [id number] DATE OF BIRTH:  Jul 05, 1955  DATE OF PROCEDURE:  08/03/2011  PREOPERATIVE DIAGNOSIS: Testicular lymphoma.   POSTOPERATIVE DIAGNOSIS: Testicular lymphoma.   OPERATION PERFORMED: Insertion of right subclavian Port-A-Cath using ultrasound guidance.   SURGEON: Taiya Nutting E. Genevive Bi, MD  ASSISTANT: None.   INDICATIONS FOR PROCEDURE: Mr. Ripberger is a 59 year old gentleman with a recent diagnosis of testicular lymphoma who requires intravenous access for chemotherapy. The indications and risks were explained and the patient gave his informed consent.   DESCRIPTION OF PROCEDURE: The patient was brought to the operating suite and placed in the supine position. Using IV sedation as well as local anesthesia the procedure was performed. The patient was prepped and draped in the usual sterile fashion. We used the ultrasound probe to identify the internal jugular vein. However, the patient was snoring resulting in marked venous collapse. Therefore I elected to proceed with a right subclavian approach. A skin wheal was raised under the right clavicle. An additional skin wheal was raised over the anterior chest wall for the Port-A-Cath site. The peri-incisional wounds were anesthetized. The right subclavian vein was percutaneously catheterized. Using a Seldinger technique, the wire was introduced into the right side of the heart and verified under fluoroscopy. The tunnel and the port site were then created. The catheter was inserted through the tunnel and positioned down through a peel-away sheath into the correct position in the heart. Again under fluoroscopic guidance the catheter was positioned to be at the junction of the superior vena cava and right atrium. The catheter flushed well. It was then secured to the Port-A-Cath. The Port-A-Cath flushed easily and irrigated nicely. The Port-A-Cath was again in proper position. It was then secured to the anterior chest wall with three  interrupted Prolene sutures through three of the four corners of the Port-A-Cath. The subcutaneous tissues were closed with 3-0 Vicryl and the skin with 4-0 Vicryl. The puncture site in the clavicle was closed with nylon.   The patient tolerated the procedure well and was taken back to the recovery room in stable condition.   ____________________________ Lew Dawes Genevive Bi, MD teo:cms D: 08/03/2011 09:00:54 ET T: 08/03/2011 09:42:38 ET JOB#: [id number]  cc: Christia Reading E. Genevive Bi, MD, <Dictator> Sandeep R. Ma Hillock, MD Louis Matte MD ELECTRONICALLY SIGNED 08/06/2011 8:53

## 2014-10-14 NOTE — Op Note (Signed)
PATIENT NAME:  Nathan Gill, Nathan Gill MR#:  [id number] DATE OF BIRTH:  10-Sep-1955  DATE OF PROCEDURE:  09/18/2011  PREOPERATIVE DIAGNOSES:  1. Complication of vascular device.  2. Requiring chemotherapy.   POSTOPERATIVE DIAGNOSES:  1. Complication of vascular device.  2. Requiring chemotherapy.   PROCEDURES PERFORMED:  1. Insertion of right IJ Infuse-a-Port.  2. Removal of existing right subclavian Infuse-a-Port.   SURGEON: Katha Cabal, MD  SEDATION: Versed 4 mg plus fentanyl 100 mcg administered IV. Continuous ECG, pulse oximetry and cardiopulmonary monitoring was performed throughout the entire procedure by the interventional radiology nurse. Total sedation time was 45 minutes.   ACCESS: Right IJ.   CONTRAST: None.   FLUORO TIME: Approximately two minutes.   INDICATIONS: Mr. Pattison is a 59 year old gentleman who requires chemotherapy. He presented to the hospital for his next round but they were unable to aspirate blood from the port itself. Portogram done on 03/28 demonstrates the port is now positioned with the tip in the distal jugular vein in a retrograde fashion. Physical examination noticed marked redundancy of the port at the subclavian insertion site as well. Therefore the port will be exchanged for an IJ port. The risks and benefits were reviewed with the patient. All questions have been answered and patient agrees to proceed.   DESCRIPTION OF PROCEDURE: Patient is taken to special procedures, placed in supine position. After adequate sedation is achieved, his right neck and chest wall are prepped and draped in a sterile fashion. Ultrasound is placed in a sterile sleeve.   Ultrasound is utilized to demonstrate the right jugular vein. 1% lidocaine is infiltrated into the soft tissues overlying the jugular vein at the base of the neck and after adequate local anesthesia, a Seldinger needle is inserted under direct ultrasound visualization, jugular vein is echolucent, homogeneous,  easily compressible indicating patency and an image was recorded for the permanent record. J-wire is then advanced under fluoroscopy so that it is positioned down in the inferior vena cava.   1% lidocaine is then infiltrated in the soft tissues surrounding the existing port and the previous incisional scar as well as from this area up to the wire insertion site. Previous incisional scar is reopened with an 11 blade scalpel and the dissection is carried down. The old port is exposed. It is grasped with a hemostat. Two Prolene tacking sutures are then transected and the port is removed completely without difficulty. Light pressure is held at the subclavian insertion site for several minutes. The tunneling device is then passed from the pocket to the wire insertion site. Small incision is made at the wire insertion site and a small pocket created with a hemostat. The port itself is then pulled through. Peel-away sheath and dilator are advanced over the wire. Dilator wire removed and the port is advanced through the peel-away sheath. Peel-away sheath is removed. Under fluoroscopy the catheter is pulled back so that its tip is at the atriocaval junction. It is then transected and connected to the port itself. Port is slipped into the existing pocket. It is then aspirated and flushed and inspected under fluoroscopy to see that it is free of redundancies and free of kinks. The port is then accessed with the standard needle and again aspirated easily flushed well. The neck counterincision is then closed with a 4-0 Monocryl subcuticular and Dermabond. The pocket is then reapproximated using interrupted 3-0 silk for the subcutaneous tissues and 4-0 Monocryl subcuticular for the skin. Dermabond is applied to the skin  edge as a dressing. The entire system is then covered with a Tegaderm to ensure the needle does not shift or move. Prior to removing the patient from the angio table it is once again aspirates quite well and it  is flushed copiously with 30 mL of heparinized saline. Patient will undergo his chemotherapy as anticipated later this afternoon.   ____________________________ Katha Cabal, MD ggs:cms D: 09/18/2011 10:05:44 ET T: 09/19/2011 11:19:35 ET  JOB#: [id number] cc: Katha Cabal, MD, <Dictator> Sandeep R. Ma Hillock, MD Katha Cabal MD ELECTRONICALLY SIGNED 09/23/2011 9:01

## 2015-01-28 ENCOUNTER — Other Ambulatory Visit: Payer: Self-pay | Admitting: *Deleted

## 2015-01-28 DIAGNOSIS — C8599 Non-Hodgkin lymphoma, unspecified, extranodal and solid organ sites: Secondary | ICD-10-CM

## 2015-01-29 ENCOUNTER — Inpatient Hospital Stay: Payer: BLUE CROSS/BLUE SHIELD | Attending: Internal Medicine

## 2015-01-29 ENCOUNTER — Ambulatory Visit
Admission: RE | Admit: 2015-01-29 | Discharge: 2015-01-29 | Disposition: A | Discharge status: Home / Self Care | Payer: BLUE CROSS/BLUE SHIELD | Source: Ambulatory Visit | Attending: Internal Medicine | Admitting: Internal Medicine

## 2015-01-29 DIAGNOSIS — R161 Splenomegaly, not elsewhere classified: Secondary | ICD-10-CM | POA: Insufficient documentation

## 2015-01-29 DIAGNOSIS — C8589 Other specified types of non-Hodgkin lymphoma, extranodal and solid organ sites: Secondary | ICD-10-CM | POA: Diagnosis present

## 2015-01-29 DIAGNOSIS — K219 Gastro-esophageal reflux disease without esophagitis: Secondary | ICD-10-CM | POA: Insufficient documentation

## 2015-01-29 DIAGNOSIS — Z923 Personal history of irradiation: Secondary | ICD-10-CM | POA: Insufficient documentation

## 2015-01-29 DIAGNOSIS — Z9221 Personal history of antineoplastic chemotherapy: Secondary | ICD-10-CM | POA: Diagnosis not present

## 2015-01-29 DIAGNOSIS — I10 Essential (primary) hypertension: Secondary | ICD-10-CM | POA: Diagnosis not present

## 2015-01-29 DIAGNOSIS — Z9079 Acquired absence of other genital organ(s): Secondary | ICD-10-CM | POA: Insufficient documentation

## 2015-01-29 DIAGNOSIS — Z806 Family history of leukemia: Secondary | ICD-10-CM | POA: Diagnosis not present

## 2015-01-29 DIAGNOSIS — C8585 Other specified types of non-Hodgkin lymphoma, lymph nodes of inguinal region and lower limb: Secondary | ICD-10-CM | POA: Insufficient documentation

## 2015-01-29 DIAGNOSIS — C8298 Follicular lymphoma, unspecified, lymph nodes of multiple sites: Secondary | ICD-10-CM

## 2015-01-29 DIAGNOSIS — I700 Atherosclerosis of aorta: Secondary | ICD-10-CM | POA: Diagnosis not present

## 2015-01-29 DIAGNOSIS — C8599 Non-Hodgkin lymphoma, unspecified, extranodal and solid organ sites: Secondary | ICD-10-CM

## 2015-01-29 DIAGNOSIS — Z08 Encounter for follow-up examination after completed treatment for malignant neoplasm: Secondary | ICD-10-CM | POA: Insufficient documentation

## 2015-01-29 DIAGNOSIS — Z79899 Other long term (current) drug therapy: Secondary | ICD-10-CM | POA: Insufficient documentation

## 2015-01-29 DIAGNOSIS — Z801 Family history of malignant neoplasm of trachea, bronchus and lung: Secondary | ICD-10-CM | POA: Diagnosis not present

## 2015-01-29 DIAGNOSIS — G629 Polyneuropathy, unspecified: Secondary | ICD-10-CM | POA: Insufficient documentation

## 2015-01-29 DIAGNOSIS — Z800 Family history of malignant neoplasm of digestive organs: Secondary | ICD-10-CM | POA: Diagnosis not present

## 2015-01-29 HISTORY — DX: Essential (primary) hypertension: I10

## 2015-01-29 HISTORY — DX: Malignant (primary) neoplasm, unspecified: C80.1

## 2015-01-29 HISTORY — DX: Non-Hodgkin lymphoma, unspecified, unspecified site: C85.90

## 2015-01-29 LAB — HEPATIC FUNCTION PANEL
ALK PHOS: 92 U/L (ref 38–126)
ALT: 32 U/L (ref 17–63)
AST: 27 U/L (ref 15–41)
Albumin: 4.3 g/dL (ref 3.5–5.0)
Bilirubin, Direct: <0.1 mg/dL — ABNORMAL LOW (ref 0.1–0.5)
Total Bilirubin: 0.6 mg/dL (ref 0.3–1.2)
Total Protein: 7.5 g/dL (ref 6.5–8.1)

## 2015-01-29 LAB — CBC WITH DIFFERENTIAL/PLATELET
BASOS ABS: 0.1 10*3/uL (ref 0–0.1)
Basophils Relative: 1 %
EOS ABS: 0.2 10*3/uL (ref 0–0.7)
HCT: 45.5 % (ref 40.0–52.0)
Hemoglobin: 15.7 g/dL (ref 13.0–18.0)
Lymphocytes Relative: 19 %
Lymphs Abs: 1.4 10*3/uL (ref 1.0–3.6)
MCH: 27.9 pg (ref 26.0–34.0)
MCHC: 34.4 g/dL (ref 32.0–36.0)
MCV: 80.9 fL (ref 80.0–100.0)
Monocytes Absolute: 0.5 10*3/uL (ref 0.2–1.0)
NEUTROS ABS: 5.2 10*3/uL (ref 1.4–6.5)
Neutrophils Relative %: 70 %
Platelets: 183 10*3/uL (ref 150–440)
RBC: 5.62 MIL/uL (ref 4.40–5.90)
RDW: 15.3 % — ABNORMAL HIGH (ref 11.5–14.5)
WBC: 7.3 10*3/uL (ref 3.8–10.6)

## 2015-01-29 LAB — CREATININE, SERUM
Creatinine, Ser: 0.78 mg/dL (ref 0.61–1.24)
GFR calc Af Amer: >60 mL/min (ref >60)
GFR calc non Af Amer: >60 mL/min (ref >60)

## 2015-01-29 LAB — LACTATE DEHYDROGENASE: LDH: 166 U/L (ref 98–192)

## 2015-01-29 MED ORDER — IOHEXOL 350 MG/ML SOLN
100.0000 mL | Freq: Once | INTRAVENOUS | Status: AC | PRN
  Administered 2015-01-29: 100 mL via INTRAVENOUS

## 2015-01-31 ENCOUNTER — Encounter: Payer: Self-pay | Admitting: Internal Medicine

## 2015-01-31 ENCOUNTER — Inpatient Hospital Stay (HOSPITAL_BASED_OUTPATIENT_CLINIC_OR_DEPARTMENT_OTHER): Payer: BLUE CROSS/BLUE SHIELD | Admitting: Internal Medicine

## 2015-01-31 VITALS — BP 130/85 | HR 82 | Temp 98.5°F | Resp 18 | Wt 242.1 lb

## 2015-01-31 DIAGNOSIS — Z800 Family history of malignant neoplasm of digestive organs: Secondary | ICD-10-CM

## 2015-01-31 DIAGNOSIS — Z923 Personal history of irradiation: Secondary | ICD-10-CM

## 2015-01-31 DIAGNOSIS — Z9079 Acquired absence of other genital organ(s): Secondary | ICD-10-CM | POA: Diagnosis not present

## 2015-01-31 DIAGNOSIS — I10 Essential (primary) hypertension: Secondary | ICD-10-CM

## 2015-01-31 DIAGNOSIS — C8589 Other specified types of non-Hodgkin lymphoma, extranodal and solid organ sites: Secondary | ICD-10-CM | POA: Diagnosis not present

## 2015-01-31 DIAGNOSIS — Z9221 Personal history of antineoplastic chemotherapy: Secondary | ICD-10-CM

## 2015-01-31 DIAGNOSIS — Z801 Family history of malignant neoplasm of trachea, bronchus and lung: Secondary | ICD-10-CM

## 2015-01-31 DIAGNOSIS — G629 Polyneuropathy, unspecified: Secondary | ICD-10-CM

## 2015-01-31 DIAGNOSIS — K219 Gastro-esophageal reflux disease without esophagitis: Secondary | ICD-10-CM

## 2015-01-31 DIAGNOSIS — Z806 Family history of leukemia: Secondary | ICD-10-CM

## 2015-01-31 DIAGNOSIS — Z79899 Other long term (current) drug therapy: Secondary | ICD-10-CM

## 2015-01-31 NOTE — Progress Notes (Signed)
Pt here for f/u lymphoma. Joint pain better this entire summer. No c/o.

## 2015-02-14 NOTE — Progress Notes (Signed)
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 PET scan showed postoperative changes.  Bone marrow biopsy negative for lymphoma.  MUGA scan normal LVEF.  HBsAg/HCV Ab/HIV Ab are negative)  Patient completed Rituxan/CHOP chemotherapy x 6 cycles (from 08/13/11 - 12/29/11),  then has completed radiation to testicle area. Got 1 cycle high-dose IV Methotrexate complicated by acute renal failure, improved. Since then got intrathecal Methoterexate for CNS prophylaxis (given with cycles 3,4,5,6 chemotherapy).    HISTORY OF PRESENT ILLNESS:  Patient returns for continued oncology evaluation, he was last seen in Feb 2016. He denies any active complaints. Eating well, no unintentional weight loss. No new cough, dyspnea, orthopnea or PND.  Denies fevers or night sweats. No abdominal or pelvic pain. Denies feeling any new masses in right testicle. Asking about getting a flu vaccine today. No new bone pains, 0/10.    REVIEW OF SYSTEMS:   ROS As in HPI above. In addition, no new headaches or focal weakness.  No new mood disturbances. No  sore throat, cough, shortness of breath, sputum, hemoptysis or chest pain. No dizziness or palpitation. No abdominal pain, constipation, diarrhea, dysuria or hematuria. No new skin rash or bleeding symptoms. No new paresthesias in extremities. PS ECOG 0.  PAST MEDICAL HISTORY: Reviewed. Past Medical History  Diagnosis Date  . Cancer   . Lymphoma 2013    chemo/radiation treatment  . Hypertension           Hypertension  GERD  CD20+ diffuse large B-cell lymphoma of left testicle s/p left radical orchiectomy on  07/08/2011  s/p acute renal failure from ATN due to high-dose IV methotrexate.  PAST SURGICAL HISTORY: Reviewed. As above.  FAMILY HISTORY: Reviewed. Family History  Problem Relation Age of Onset  . Lung cancer Father   . Leukemia Maternal Uncle   . Melanoma Paternal Grandfather   . Colon cancer Cousin     SOCIAL HISTORY: Reviewed. Social History  Substance Use Topics  . Smoking status: Never Smoker   . Smokeless tobacco: Never Used  . Alcohol Use: Yes     Comment: average 2 cans a month at most    No Known Allergies  Current Outpatient Prescriptions  Medication Sig Dispense Refill  . acetaminophen (TYLENOL) 325 MG tablet Take 325 mg by mouth every 6 (six) hours as needed.    Marland Kitchen amLODipine (NORVASC) 10 MG tablet Take 10 mg by mouth.    Marland Kitchen ibuprofen (ADVIL,MOTRIN) 200 MG tablet Take 200 mg by mouth every 6 (six) hours as needed.    Marland Kitchen omeprazole (PRILOSEC OTC) 20 MG tablet Take 20 mg by mouth.     No current facility-administered medications for this visit.    PHYSICAL EXAM: Filed Vitals:   01/31/15 1049  BP: 130/85  Pulse: 82  Temp: 98.5 F (36.9 C)  Resp: 18     There is no height on file to calculate BMI.    ECOG FS:0 - Asymptomatic  GENERAL: Patient is alert and oriented and in no acute distress. There is no icterus. HEENT: EOMs intact. Oral exam negative for thrush or lesions. No cervical lymphadenopathy. CVS: S1S2, regular LUNGS: Bilaterally clear to auscultation, no rhonchi.  ABDOMEN: Soft, nontender. No hepatosplenomegaly clinically. No tenderness or mass in the right testicle.Marland Kitchen  NEURO: grossly nonfocal, cranial nerves are intact. Gait unremarkable. EXTREMITIES: No pedal edema. LYMPHATICS: No palpable adenopathy in axillary or inguinal areas.   LAB RESULTS:    Component Value Date/Time   NA 137 03/23/2013 0932   K 4.1 03/23/2013 0932   CL 101 03/23/2013 0932   CO2 29 03/23/2013 0932   GLUCOSE 96 03/23/2013 0932   BUN 28* 03/23/2013 0932   CREATININE  0.78 01/29/2015 0805   CREATININE 1.16 07/26/2014 0903   CALCIUM 8.3* 03/23/2013 0932   PROT 7.5 01/29/2015 0805   PROT 7.7 07/26/2014 0903   ALBUMIN 4.3 01/29/2015 0805   ALBUMIN 3.9 07/26/2014 0903   AST 27 01/29/2015 0805   AST 23 07/26/2014 0903   ALT 32 01/29/2015 0805   ALT 41 07/26/2014 0903   ALKPHOS 92 01/29/2015 0805   ALKPHOS 103 07/26/2014 0903   BILITOT 0.6 01/29/2015 0805   BILITOT 0.4 07/26/2014 0903   GFRNONAA >60 01/29/2015 0805   GFRNONAA >60 07/26/2014 0903   GFRNONAA >60 07/10/2013 0833   GFRAA >60 01/29/2015 0805   GFRAA >60 07/26/2014 0903   GFRAA >60 07/10/2013 0833    Lab Results  Component Value Date   WBC 7.3 01/29/2015   NEUTROABS 5.2 01/29/2015   HGB 15.7 01/29/2015   HCT 45.5 01/29/2015   MCV 80.9 01/29/2015   PLT 183 01/29/2015    STUDIES: Ct Chest W Contrast  01/29/2015   CLINICAL DATA:  Follicular lymphoma.  Routine follow-up.  EXAM: CT CHEST, ABDOMEN, AND PELVIS WITH CONTRAST  TECHNIQUE: Multidetector CT imaging of the chest, abdomen and pelvis was performed following the standard protocol during bolus administration of intravenous contrast.  CONTRAST:  15m OMNIPAQUE IOHEXOL 350 MG/ML SOLN  COMPARISON:  11/09/2011  FINDINGS: CT CHEST FINDINGS  Mediastinum: The heart size appears normal. No pericardial effusion identified. There is no mediastinal or hilar adenopathy identified. No axillary or supraclavicular adenopathy noted. The trachea appears patent and is midline. Normal appearance of the esophagus.  Lungs/Pleura: No pleural effusion identified. No airspace consolidation or atelectasis noted. No suspicious pulmonary nodule or mass.  Musculoskeletal: There is no aggressive lytic or sclerotic bone lesion identified.  CT ABDOMEN AND PELVIS FINDINGS  Hepatobiliary: Low-attenuation structure within the lateral segment of left lobe measures 1.2 cm, image 54 of series 2. Unchanged from previous exam favoring a simple cysts. The gallbladder is normal.  No biliary dilatation.  Pancreas: Negative  Spleen: The spleen has a length of 15 cm, image 93/series 5. Unchanged from previous exam. Heterogeneous appearance of the splenic parenchyma is again noted.  Adrenals/Urinary Tract: The adrenal glands are both normal. On unremarkable appearance of the right kidney. The left kidney is normal. Urinary bladder appears normal.  Stomach/Bowel: The stomach and the small bowel loops have a normal course and caliber. No obstruction. The appendix is visualized and appears normal. Unremarkable appearance of the colon.  Vascular/Lymphatic: Calcified atherosclerotic disease involves the abdominal aorta. No aneurysm. No enlarged retroperitoneal or mesenteric adenopathy. No enlarged pelvic or inguinal lymph nodes.  Reproductive: Prostate gland and seminal vesicles are unremarkable.  Other: There is no ascites or focal fluid collections within the abdomen or pelvis.  Musculoskeletal: No aggressive lytic or sclerotic bone lesion identified. Degenerative disc disease noted within the lower lumbar spine.  IMPRESSION: 1. Stable CT of the chest abdomen and pelvis. No evidence for residual or recurrent adenopathy. 2. Stable splenomegaly. 3. Aortic  atherosclerosis.   Electronically Signed   By: Kerby Moors M.D.   On: 01/29/2015 11:12   Ct Abdomen Pelvis W Contrast  01/29/2015   CLINICAL DATA:  Follicular lymphoma.  Routine follow-up.  EXAM: CT CHEST, ABDOMEN, AND PELVIS WITH CONTRAST  TECHNIQUE: Multidetector CT imaging of the chest, abdomen and pelvis was performed following the standard protocol during bolus administration of intravenous contrast.  CONTRAST:  145m OMNIPAQUE IOHEXOL 350 MG/ML SOLN  COMPARISON:  11/09/2011  FINDINGS: CT CHEST FINDINGS  Mediastinum: The heart size appears normal. No pericardial effusion identified. There is no mediastinal or hilar adenopathy identified. No axillary or supraclavicular adenopathy noted. The trachea appears patent and is midline. Normal  appearance of the esophagus.  Lungs/Pleura: No pleural effusion identified. No airspace consolidation or atelectasis noted. No suspicious pulmonary nodule or mass.  Musculoskeletal: There is no aggressive lytic or sclerotic bone lesion identified.  CT ABDOMEN AND PELVIS FINDINGS  Hepatobiliary: Low-attenuation structure within the lateral segment of left lobe measures 1.2 cm, image 54 of series 2. Unchanged from previous exam favoring a simple cysts. The gallbladder is normal. No biliary dilatation.  Pancreas: Negative  Spleen: The spleen has a length of 15 cm, image 93/series 5. Unchanged from previous exam. Heterogeneous appearance of the splenic parenchyma is again noted.  Adrenals/Urinary Tract: The adrenal glands are both normal. On unremarkable appearance of the right kidney. The left kidney is normal. Urinary bladder appears normal.  Stomach/Bowel: The stomach and the small bowel loops have a normal course and caliber. No obstruction. The appendix is visualized and appears normal. Unremarkable appearance of the colon.  Vascular/Lymphatic: Calcified atherosclerotic disease involves the abdominal aorta. No aneurysm. No enlarged retroperitoneal or mesenteric adenopathy. No enlarged pelvic or inguinal lymph nodes.  Reproductive: Prostate gland and seminal vesicles are unremarkable.  Other: There is no ascites or focal fluid collections within the abdomen or pelvis.  Musculoskeletal: No aggressive lytic or sclerotic bone lesion identified. Degenerative disc disease noted within the lower lumbar spine.  IMPRESSION: 1. Stable CT of the chest abdomen and pelvis. No evidence for residual or recurrent adenopathy. 2. Stable splenomegaly. 3. Aortic atherosclerosis.   Electronically Signed   By: TKerby MoorsM.D.   On: 01/29/2015 11:12     ASSESSMENT / PLAN:   1. 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 PET scan showed postoperative changes.  Bone marrow biopsy negative for lymphoma.  MUGA scan normal LVEF  - reviewed labs and recent CT scan done and d/w patient. CT scan does not report any evidence of recurrent lymphoma. Patient is doing well also without any clinical evidence to suggest recurrent lymphoma. Continue surveillance. Will get CBC, Cr, LFT, LDH next August 2017 and get CT chest/abdomen/pelvis with contrast for continued lymphoma surveillance. Next MD appt at 2-3 days after above CT scan is done. 2. Peripheral neuropathy - improving well. Not on medication.  3. In between visits, the patient has been advised to call or come to the ER in case of fevers, bleeding, acute sickness or new symptoms. Patient is agreeable to this plan.    SLeia Alf MD   02/14/2015 11:31 AM

## 2015-12-10 ENCOUNTER — Other Ambulatory Visit: Payer: Self-pay

## 2016-01-06 ENCOUNTER — Ambulatory Visit (INDEPENDENT_AMBULATORY_CARE_PROVIDER_SITE_OTHER): Payer: BLUE CROSS/BLUE SHIELD | Admitting: Family Medicine

## 2016-01-06 ENCOUNTER — Encounter: Payer: Self-pay | Admitting: Family Medicine

## 2016-01-06 VITALS — BP 140/92 | HR 82 | Temp 97.8°F | Resp 16 | Ht 70.0 in | Wt 213.0 lb

## 2016-01-06 DIAGNOSIS — B349 Viral infection, unspecified: Secondary | ICD-10-CM

## 2016-01-06 MED ORDER — DOXYCYCLINE HYCLATE 100 MG PO TABS: 100.0000 mg | ORAL_TABLET | Freq: Two times a day (BID) | ORAL | Status: DC

## 2016-01-06 MED ORDER — VALACYCLOVIR HCL 1 G PO TABS
1000.0000 mg | ORAL_TABLET | Freq: Two times a day (BID) | ORAL | Status: DC
Start: 2016-01-06 — End: 2016-01-28

## 2016-01-06 NOTE — Progress Notes (Signed)
Name: Nathan Gill   MRN: [id number]    DOB: 29-Dec-1955   Date:01/06/2016       Progress Note  Subjective  Chief Complaint  Chief Complaint  Patient presents with  . Rash on penis    Noticed about a week ago - no new sexual partners     Rash This is a new problem. The current episode started 1 to 4 weeks ago. The problem is unchanged. The affected locations include the genitalia. The rash is characterized by blistering. He was exposed to nothing. Pertinent negatives include no anorexia, congestion, cough, diarrhea, eye pain, facial edema, fatigue, fever, joint pain, nail changes, rhinorrhea, shortness of breath, sore throat or vomiting.    No problem-specific assessment & plan notes found for this encounter.   Past Medical History  Diagnosis Date  . Cancer ([place])   . Lymphoma ([place]) 2013    chemo/radiation treatment  . Hypertension     No past surgical history on file.  Family History  Problem Relation Age of Onset  . Lung cancer Father   . Leukemia Maternal Uncle   . Melanoma Paternal Grandfather   . Colon cancer Cousin     Social History   Social History  . Marital Status: Divorced    Spouse Name: N/A  . Number of Children: N/A  . Years of Education: N/A   Occupational History  . Not on file.   Social History Main Topics  . Smoking status: Never Smoker   . Smokeless tobacco: Never Used  . Alcohol Use: Yes     Comment: average 2 cans a month at most  . Drug Use: No  . Sexual Activity: Not on file   Other Topics Concern  . Not on file   Social History Narrative    No Known Allergies   Review of Systems  Constitutional: Negative for fever, chills, weight loss, malaise/fatigue and fatigue.  HENT: Negative for congestion, ear discharge, ear pain, rhinorrhea and sore throat.   Eyes: Negative for blurred vision and pain.  Respiratory: Negative for cough, sputum production, shortness of breath and wheezing.   Cardiovascular: Negative for chest pain,  palpitations and leg swelling.  Gastrointestinal: Negative for heartburn, nausea, vomiting, abdominal pain, diarrhea, constipation, blood in stool, melena and anorexia.  Genitourinary: Negative for dysuria, urgency, frequency and hematuria.  Musculoskeletal: Negative for myalgias, back pain, joint pain and neck pain.  Skin: Positive for rash. Negative for nail changes.  Neurological: Negative for dizziness, tingling, sensory change, focal weakness and headaches.  Endo/Heme/Allergies: Negative for environmental allergies and polydipsia. Does not bruise/bleed easily.  Psychiatric/Behavioral: Negative for depression and suicidal ideas. The patient is not nervous/anxious and does not have insomnia.      Objective  Filed Vitals:   01/06/16 0805  BP: 140/92  Pulse: 82  Temp: 97.8 F (36.6 C)  TempSrc: Oral  Resp: 16  Weight: 213 lb (96.616 kg)    Physical Exam  Constitutional: He is oriented to person, place, and time and well-developed, well-nourished, and in no distress.  HENT:  Head: Normocephalic.  Right Ear: External ear normal.  Left Ear: External ear normal.  Nose: Nose normal.  Mouth/Throat: Oropharynx is clear and moist.  Eyes: Conjunctivae and EOM are normal. Pupils are equal, round, and reactive to light. Right eye exhibits no discharge. Left eye exhibits no discharge. No scleral icterus.  Neck: Normal range of motion. Neck supple. No JVD present. No tracheal deviation present. No thyromegaly present.  Cardiovascular: Normal rate,  regular rhythm, normal heart sounds and intact distal pulses.  Exam reveals no gallop and no friction rub.   No murmur heard. Pulmonary/Chest: Breath sounds normal. No respiratory distress. He has no wheezes. He has no rales.  Abdominal: Soft. Bowel sounds are normal. He exhibits no mass. There is no hepatosplenomegaly. There is no tenderness. There is no rebound, no guarding and no CVA tenderness.  Musculoskeletal: Normal range of motion. He  exhibits no edema or tenderness.  Lymphadenopathy:    He has no cervical adenopathy.  Neurological: He is alert and oriented to person, place, and time. He has normal sensation, normal strength, normal reflexes and intact cranial nerves. No cranial nerve deficit.  Skin: Skin is warm. Rash noted. Rash is vesicular. There is erythema.  Psychiatric: Mood and affect normal.  Nursing note and vitals reviewed.     Assessment & Plan  Problem List Items Addressed This Visit    None    Visit Diagnoses    Viral syndrome    -  Primary    Relevant Medications    valACYclovir (VALTREX) 1000 MG tablet    doxycycline (VIBRA-TABS) 100 MG tablet    Other Relevant Orders    Herpes simplex virus culture         Dr. [facility]  01/06/2016

## 2016-01-09 ENCOUNTER — Telehealth: Payer: Self-pay

## 2016-01-09 LAB — HERPES SIMPLEX VIRUS CULTURE

## 2016-01-09 NOTE — Telephone Encounter (Signed)
Patient notified

## 2016-01-27 ENCOUNTER — Ambulatory Visit
Admission: RE | Admit: 2016-01-27 | Discharge: 2016-01-27 | Disposition: A | Discharge status: Home / Self Care | Payer: BLUE CROSS/BLUE SHIELD | Source: Ambulatory Visit | Attending: Internal Medicine | Admitting: Internal Medicine

## 2016-01-27 ENCOUNTER — Other Ambulatory Visit: Payer: Self-pay | Admitting: *Deleted

## 2016-01-27 DIAGNOSIS — C8590 Non-Hodgkin lymphoma, unspecified, unspecified site: Secondary | ICD-10-CM

## 2016-01-27 DIAGNOSIS — I2510 Atherosclerotic heart disease of native coronary artery without angina pectoris: Secondary | ICD-10-CM | POA: Diagnosis not present

## 2016-01-27 DIAGNOSIS — C8589 Other specified types of non-Hodgkin lymphoma, extranodal and solid organ sites: Secondary | ICD-10-CM | POA: Diagnosis not present

## 2016-01-27 DIAGNOSIS — I700 Atherosclerosis of aorta: Secondary | ICD-10-CM | POA: Diagnosis not present

## 2016-01-27 MED ORDER — IOPAMIDOL (ISOVUE-300) INJECTION 61%
125.0000 mL | Freq: Once | INTRAVENOUS | Status: AC | PRN
  Administered 2016-01-27: 125 mL via INTRAVENOUS

## 2016-01-28 ENCOUNTER — Inpatient Hospital Stay: Payer: BLUE CROSS/BLUE SHIELD | Attending: Internal Medicine | Admitting: Internal Medicine

## 2016-01-28 ENCOUNTER — Inpatient Hospital Stay: Payer: BLUE CROSS/BLUE SHIELD

## 2016-01-28 DIAGNOSIS — Z806 Family history of leukemia: Secondary | ICD-10-CM | POA: Diagnosis not present

## 2016-01-28 DIAGNOSIS — Z923 Personal history of irradiation: Secondary | ICD-10-CM

## 2016-01-28 DIAGNOSIS — Z79899 Other long term (current) drug therapy: Secondary | ICD-10-CM | POA: Diagnosis not present

## 2016-01-28 DIAGNOSIS — Z800 Family history of malignant neoplasm of digestive organs: Secondary | ICD-10-CM | POA: Insufficient documentation

## 2016-01-28 DIAGNOSIS — Z8572 Personal history of non-Hodgkin lymphomas: Secondary | ICD-10-CM | POA: Insufficient documentation

## 2016-01-28 DIAGNOSIS — Z9221 Personal history of antineoplastic chemotherapy: Secondary | ICD-10-CM | POA: Diagnosis not present

## 2016-01-28 DIAGNOSIS — C8590 Non-Hodgkin lymphoma, unspecified, unspecified site: Secondary | ICD-10-CM

## 2016-01-28 DIAGNOSIS — Z801 Family history of malignant neoplasm of trachea, bronchus and lung: Secondary | ICD-10-CM | POA: Diagnosis not present

## 2016-01-28 DIAGNOSIS — C8589 Other specified types of non-Hodgkin lymphoma, extranodal and solid organ sites: Secondary | ICD-10-CM | POA: Insufficient documentation

## 2016-01-28 DIAGNOSIS — I10 Essential (primary) hypertension: Secondary | ICD-10-CM | POA: Diagnosis not present

## 2016-01-28 LAB — CBC WITH DIFFERENTIAL/PLATELET
BASOS ABS: 0 10*3/uL (ref 0–0.1)
BASOS PCT: 1 %
EOS ABS: 0.1 10*3/uL (ref 0–0.7)
EOS PCT: 2 %
HCT: 44.5 % (ref 40.0–52.0)
Hemoglobin: 15.1 g/dL (ref 13.0–18.0)
LYMPHS PCT: 21 %
Lymphs Abs: 1.2 10*3/uL (ref 1.0–3.6)
MCH: 28.7 pg (ref 26.0–34.0)
MCHC: 34 g/dL (ref 32.0–36.0)
MCV: 84.4 fL (ref 80.0–100.0)
MONO ABS: 0.4 10*3/uL (ref 0.2–1.0)
Monocytes Relative: 8 %
Neutro Abs: 3.8 10*3/uL (ref 1.4–6.5)
Neutrophils Relative %: 68 %
PLATELETS: 189 10*3/uL (ref 150–440)
RBC: 5.27 MIL/uL (ref 4.40–5.90)
RDW: 14.5 % (ref 11.5–14.5)
WBC: 5.5 10*3/uL (ref 3.8–10.6)

## 2016-01-28 LAB — HEPATIC FUNCTION PANEL
ALT: 26 U/L (ref 17–63)
AST: 20 U/L (ref 15–41)
Albumin: 4.4 g/dL (ref 3.5–5.0)
Alkaline Phosphatase: 68 U/L (ref 38–126)
BILIRUBIN TOTAL: 0.5 mg/dL (ref 0.3–1.2)
Total Protein: 7.4 g/dL (ref 6.5–8.1)

## 2016-01-28 LAB — CREATININE, SERUM: CREATININE: 0.86 mg/dL (ref 0.61–1.24)

## 2016-01-28 LAB — LACTATE DEHYDROGENASE: LDH: 137 U/L (ref 98–192)

## 2016-01-28 NOTE — Progress Notes (Signed)
[place] [place] OFFICE PROGRESS NOTE  Patient Care Team: Juline Patch, MD as PCP - General (Family Medicine)  No matching staging information was found for the patient.   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 PET scan showed postoperative changes.  Bone marrow biopsy negative for lymphoma.  MUGA scan normal LVEF.  HBsAg/HCV Ab/HIV Ab are negative)  Patient completed Rituxan/CHOP chemotherapy x 6 cycles (from 08/13/11 - 12/29/11),  then has completed radiation to testicle area. Got 1 cycle high-dose IV Methotrexate complicated by acute renal failure, improved. Since then got intrathecal Methoterexate for CNS prophylaxis (given with cycles 3,4,5,6 chemotherapy).     Large cell lymphoma, extranodal and solid organ sites [facility])   01/28/2016 Initial Diagnosis    Large cell lymphoma, extranodal and solid organ sites [facility])       This is my first interaction with the patient as patient's primary oncologist has been [place]. I reviewed the patient's prior charts/pertinent labs/imaging in detail; findings are summarized above.     INTERVAL HISTORY:  Nathan Gill 60 y.o.  male pleasant patient above history of Stage IE testicular lymphoma status post CHOP chemotherapy. Patient also received CNS prophylaxis with methotrexate.  Patient doing well. Denies any lumps or bumps. No nausea no vomiting. No chest pain shortness of breath cough.   REVIEW OF SYSTEMS:  A complete 10 point review of system is done which is negative except mentioned above/history of present illness.   PAST MEDICAL HISTORY :  Past Medical History:  Diagnosis Date  . Cancer ([place])   . Hypertension   . Lymphoma ([place]) 2013   chemo/radiation treatment    PAST SURGICAL HISTORY :  No past surgical history on file.  FAMILY HISTORY :   Family History  Problem Relation Age of  Onset  . Lung cancer Father   . Leukemia Maternal Uncle   . Melanoma Paternal Grandfather   . Colon cancer Cousin     SOCIAL HISTORY:   Social History  Substance Use Topics  . Smoking status: Never Smoker  . Smokeless tobacco: Never Used  . Alcohol use Yes     Comment: average 2 cans a month at most    ALLERGIES:  has No Known Allergies.  MEDICATIONS:  Current Outpatient Prescriptions  Medication Sig Dispense Refill  . acetaminophen (TYLENOL) 325 MG tablet Take 325 mg by mouth every 6 (six) hours as needed.    Marland Kitchen amLODipine (NORVASC) 10 MG tablet Take 10 mg by mouth.    Marland Kitchen ibuprofen (ADVIL,MOTRIN) 200 MG tablet Take 200 mg by mouth every 6 (six) hours as needed.     No current facility-administered medications for this visit.     PHYSICAL EXAMINATION: ECOG PERFORMANCE STATUS: 0 - Asymptomatic  BP 119/81 (BP Location: Left Arm, Patient Position: Sitting)   Pulse 67   Temp 98.5 F (36.9 C) (Tympanic)   Resp 18   Wt 213 lb 10 oz (96.9 kg)   BMI 30.65 kg/m   Filed Weights   01/28/16 1049  Weight: 213 lb 10 oz (96.9 kg)    GENERAL: Well-nourished well-developed; Alert, no distress and comfortable.   Alone.  EYES: no pallor or icterus OROPHARYNX: no thrush or ulceration; good dentition  NECK: supple, no masses felt LYMPH:  no palpable lymphadenopathy in the cervical, axillary or inguinal regions LUNGS: clear to auscultation and  No wheeze  or crackles HEART/CVS: regular rate & rhythm and no murmurs; No lower extremity edema ABDOMEN:abdomen soft, non-tender and normal bowel sounds Musculoskeletal:no cyanosis of digits and no clubbing  PSYCH: alert & oriented x 3 with fluent speech NEURO: no focal motor/sensory deficits SKIN:  no rashes or significant lesions; testicular exam shows absent testicle on the left side ; normal the right.   LABORATORY DATA:  I have reviewed the data as listed    Component Value Date/Time   NA 137 03/23/2013 0932   K 4.1 03/23/2013 0932    CL 101 03/23/2013 0932   CO2 29 03/23/2013 0932   GLUCOSE 96 03/23/2013 0932   BUN 28 (H) 03/23/2013 0932   CREATININE 0.78 01/29/2015 0805   CREATININE 1.16 07/26/2014 0903   CALCIUM 8.3 (L) 03/23/2013 0932   PROT 7.5 01/29/2015 0805   PROT 7.7 07/26/2014 0903   ALBUMIN 4.3 01/29/2015 0805   ALBUMIN 3.9 07/26/2014 0903   AST 27 01/29/2015 0805   AST 23 07/26/2014 0903   ALT 32 01/29/2015 0805   ALT 41 07/26/2014 0903   ALKPHOS 92 01/29/2015 0805   ALKPHOS 103 07/26/2014 0903   BILITOT 0.6 01/29/2015 0805   BILITOT 0.4 07/26/2014 0903   GFRNONAA >60 01/29/2015 0805   GFRNONAA >60 07/26/2014 0903   GFRNONAA >60 07/10/2013 0833   GFRAA >60 01/29/2015 0805   GFRAA >60 07/26/2014 0903   GFRAA >60 07/10/2013 0833    No results found for: SPEP, UPEP  Lab Results  Component Value Date   WBC 7.3 01/29/2015   NEUTROABS 5.2 01/29/2015   HGB 15.7 01/29/2015   HCT 45.5 01/29/2015   MCV 80.9 01/29/2015   PLT 183 01/29/2015      Chemistry      Component Value Date/Time   NA 137 03/23/2013 0932   K 4.1 03/23/2013 0932   CL 101 03/23/2013 0932   CO2 29 03/23/2013 0932   BUN 28 (H) 03/23/2013 0932   CREATININE 0.78 01/29/2015 0805   CREATININE 1.16 07/26/2014 0903      Component Value Date/Time   CALCIUM 8.3 (L) 03/23/2013 0932   ALKPHOS 92 01/29/2015 0805   ALKPHOS 103 07/26/2014 0903   AST 27 01/29/2015 0805   AST 23 07/26/2014 0903   ALT 32 01/29/2015 0805   ALT 41 07/26/2014 0903   BILITOT 0.6 01/29/2015 0805   BILITOT 0.4 07/26/2014 0903       RADIOGRAPHIC STUDIES: I have personally reviewed the radiological images as listed and agreed with the findings in the report. Ct Abdomen Pelvis W Contrast  Result Date: 01/27/2016 CLINICAL DATA:  Testicular lymphoma. EXAM: CT CHEST, ABDOMEN, AND PELVIS WITH CONTRAST TECHNIQUE: Multidetector CT imaging of the chest, abdomen and pelvis was performed following the standard protocol during bolus administration of  intravenous contrast. CONTRAST:  100 cc Isovue-300. COMPARISON:  01/29/2015. FINDINGS: CT CHEST FINDINGS Cardiovascular: Atherosclerotic calcification of the arterial vasculature, including coronary arteries. Heart size normal. No pericardial effusion. Mediastinum/Nodes: No pathologically enlarged mediastinal, hilar or axillary lymph nodes. Esophagus is grossly unremarkable. Lungs/Pleura: Lungs are clear. No pleural fluid. Airway is unremarkable. Musculoskeletal: No worrisome lytic or sclerotic lesions. CT ABDOMEN PELVIS FINDINGS Hepatobiliary: 12 mm low-attenuation lesion in the left hepatic lobe is unchanged. Liver and gallbladder are otherwise unremarkable. No biliary ductal dilatation. Pancreas: Negative. Spleen: Negative. Adrenals/Urinary Tract: Adrenal glands are unremarkable. Sub cm low-attenuation lesion in the right kidney is likely a cyst. Kidneys are otherwise unremarkable. Ureters are decompressed. Bladder is unremarkable. Stomach/Bowel:  Stomach, small bowel, appendix and colon are unremarkable. Vascular/Lymphatic: Atherosclerotic calcification of the arterial vasculature without abdominal aortic aneurysm. No pathologically enlarged lymph nodes. Reproductive: Prostate is normal in size. There is perineal edema, as before. Other: Small bilateral inguinal hernias contain fat. No free fluid. Mesenteries and peritoneum are unremarkable. Tiny periumbilical hernia contains fat. Musculoskeletal: No worrisome lytic or sclerotic lesions. Degenerative changes are seen in the spine. IMPRESSION: 1. No evidence of recurrent lymphoma. 2. Aortic atherosclerosis and coronary artery calcification. Electronically Signed   By: Lorin Picket M.D.   On: 01/27/2016 15:30     ASSESSMENT & PLAN:  Large cell lymphoma, extranodal and solid organ sites [facility]) Diffuse large B-cell lymphoma of the testis stage IE status post R CHOP chemotherapy/intrathecal chemoprophylaxis. CT scan chest and pelvis-August 2017 NED.  #  Discussed with the patient that here is no evidence of recurrence. Patient is likely cured of his lymphoma. Would not recommend imaging unless clinically indicated.  # Recommend follow-up in 1 year with labs. He will have his labs in today.   No orders of the defined types were placed in this encounter.  All questions were answered. The patient knows to call the clinic with any problems, questions or concerns.      Cammie Sickle, MD 01/28/2016 11:09 AM

## 2016-01-28 NOTE — Assessment & Plan Note (Signed)
Diffuse large B-cell lymphoma of the testis stage IE status post R CHOP chemotherapy/intrathecal chemoprophylaxis. CT scan chest and pelvis-August 2017 NED.  # Discussed with the patient that here is no evidence of recurrence. Patient is likely cured of his lymphoma. Would not recommend imaging unless clinically indicated.  # Recommend follow-up in 1 year with labs. He will have his labs in today.

## 2016-01-30 ENCOUNTER — Ambulatory Visit: Payer: BLUE CROSS/BLUE SHIELD | Admitting: Internal Medicine

## 2016-04-21 ENCOUNTER — Encounter: Payer: Self-pay | Admitting: Family Medicine

## 2016-04-21 ENCOUNTER — Ambulatory Visit (INDEPENDENT_AMBULATORY_CARE_PROVIDER_SITE_OTHER): Payer: BLUE CROSS/BLUE SHIELD | Admitting: Family Medicine

## 2016-04-21 VITALS — BP 128/78 | HR 68 | Ht 71.0 in | Wt 224.0 lb

## 2016-04-21 DIAGNOSIS — M7712 Lateral epicondylitis, left elbow: Secondary | ICD-10-CM

## 2016-04-21 DIAGNOSIS — G5602 Carpal tunnel syndrome, left upper limb: Secondary | ICD-10-CM

## 2016-04-21 MED ORDER — MELOXICAM 15 MG PO TABS: 15.0000 mg | ORAL_TABLET | Freq: Every day | ORAL | 2 refills | Status: DC

## 2016-04-21 NOTE — Progress Notes (Signed)
Name: Nathan Gill   MRN: [id number]    DOB: May 02, 1956   Date:04/21/2016       Progress Note  Subjective  Chief Complaint  Chief Complaint  Patient presents with  . Arm Pain    Left elbow has constant pain. On a scale from 0 to 10 its a 5. Radiates down to left hand. Weakness in left hand, having trouble gripping things.    Arm Pain   There was no injury mechanism (couple of years waxing/waning). The pain is present in the left elbow. The quality of the pain is described as aching. The pain radiates to the left arm. The pain is at a severity of 5/10. The pain is moderate. The pain has been fluctuating since the incident. Associated symptoms include muscle weakness. Pertinent negatives include no chest pain, numbness or tingling. The symptoms are aggravated by movement (wrist extension). Treatments tried: pred. The treatment provided moderate relief.    No problem-specific Assessment & Plan notes found for this encounter.   Past Medical History:  Diagnosis Date  . Cancer ([place])   . Hypertension   . Lymphoma ([place]) 2013   chemo/radiation treatment    History reviewed. No pertinent surgical history.  Family History  Problem Relation Age of Onset  . Lung cancer Father   . Leukemia Maternal Uncle   . Melanoma Paternal Grandfather   . Colon cancer Cousin     Social History   Social History  . Marital status: Divorced    Spouse name: N/A  . Number of children: N/A  . Years of education: N/A   Occupational History  . Not on file.   Social History Main Topics  . Smoking status: Never Smoker  . Smokeless tobacco: Never Used  . Alcohol use Yes     Comment: average 2 cans a month at most  . Drug use: No  . Sexual activity: Not on file   Other Topics Concern  . Not on file   Social History Narrative  . No narrative on file    No Known Allergies   Review of Systems  Constitutional: Negative for chills, fever, malaise/fatigue and weight loss.  HENT: Negative for  ear discharge, ear pain and sore throat.   Eyes: Negative for blurred vision.  Respiratory: Negative for cough, sputum production, shortness of breath and wheezing.   Cardiovascular: Negative for chest pain, palpitations and leg swelling.  Gastrointestinal: Negative for abdominal pain, blood in stool, constipation, diarrhea, heartburn, melena and nausea.  Genitourinary: Negative for dysuria, frequency, hematuria and urgency.  Musculoskeletal: Negative for back pain, joint pain, myalgias and neck pain.  Skin: Negative for rash.  Neurological: Negative for dizziness, tingling, sensory change, focal weakness, numbness and headaches.  Endo/Heme/Allergies: Negative for environmental allergies and polydipsia. Does not bruise/bleed easily.  Psychiatric/Behavioral: Negative for depression and suicidal ideas. The patient is not nervous/anxious and does not have insomnia.      Objective  Vitals:   04/21/16 1336  BP: 128/78  Pulse: 68  Weight: 224 lb (101.6 kg)  Height: 5\' 11"  (1.803 m)    Physical Exam  Constitutional: He is oriented to person, place, and time and well-developed, well-nourished, and in no distress.  HENT:  Head: Normocephalic.  Right Ear: External ear normal.  Left Ear: External ear normal.  Nose: Nose normal.  Mouth/Throat: Oropharynx is clear and moist.  Eyes: Conjunctivae and EOM are normal. Pupils are equal, round, and reactive to light. Right eye exhibits no discharge. Left eye  exhibits no discharge. No scleral icterus.  Neck: Normal range of motion. Neck supple. No JVD present. No tracheal deviation present. No thyromegaly present.  Cardiovascular: Normal rate, regular rhythm, normal heart sounds and intact distal pulses.  Exam reveals no gallop and no friction rub.   No murmur heard. Pulmonary/Chest: Breath sounds normal. No respiratory distress. He has no wheezes. He has no rales.  Abdominal: Soft. Bowel sounds are normal. He exhibits no mass. There is no  hepatosplenomegaly. There is no tenderness. There is no rebound, no guarding and no CVA tenderness.  Musculoskeletal: Normal range of motion. He exhibits no edema.       Left elbow: Tenderness found. Lateral epicondyle tenderness noted.       Left wrist: Normal.  Positive tinel/neg phelen  Lymphadenopathy:    He has no cervical adenopathy.  Neurological: He is alert and oriented to person, place, and time. He has normal sensation, normal strength, normal reflexes and intact cranial nerves. No cranial nerve deficit.  Skin: Skin is warm. No rash noted.  Psychiatric: Mood and affect normal.      Assessment & Plan  Problem List Items Addressed This Visit    None    Visit Diagnoses    Lateral epicondylitis of left elbow    -  Primary   Relevant Medications   meloxicam (MOBIC) 15 MG tablet   Other Relevant Orders   Ambulatory referral to Orthopedic Surgery   Carpal tunnel syndrome of left wrist       Relevant Medications   meloxicam (MOBIC) 15 MG tablet   Other Relevant Orders   Ambulatory referral to Orthopedic Surgery        Dr. Otilio Miu [facility]  04/21/16

## 2016-04-21 NOTE — Patient Instructions (Signed)
Tennis Elbow Tennis elbow (lateral epicondylitis) is inflammation of the outer tendons of your forearm close to your elbow. Your tendons attach your muscles to your bones. The outer tendons of your forearm are used to extend your wrist, and they attach on the outside part of your elbow. Tennis elbow is often found in people who play tennis, but anyone may get the condition from repeatedly extending the wrist or turning the forearm. CAUSES This condition is caused by repeatedly extending your wrist and using your hands. It can result from sports or work that requires repetitive forearm movements. Tennis elbow may also be caused by an injury. RISK FACTORS You have a higher risk of developing tennis elbow if you play tennis or another racquet sport. You also have a higher risk if you frequently use your hands for work. This condition is also more likely to develop in:  Musicians.  Carpenters, painters, and plumbers.  Cooks.  Cashiers.  People who work in factories.  Construction workers.  Butchers.  People who use computers. SYMPTOMS Symptoms of this condition include:  Pain and tenderness in your forearm and the outer part of your elbow. You may only feel the pain when you use your arm, or you may feel it even when you are not using your arm.  A burning feeling that runs from your elbow through your arm.  Weak grip in your hands. DIAGNOSIS  This condition may be diagnosed by medical history and physical exam. You may also have other tests, including:  X-rays.  MRI. TREATMENT Your health care provider will recommend lifestyle adjustments, such as resting and icing your arm. Treatment may also include:  Medicines for inflammation. This may include shots of cortisone if your pain continues.  Physical therapy. This may include massage or exercises.  An elbow brace. Surgery may eventually be recommended if your pain does not go away with treatment. HOME CARE  INSTRUCTIONS Activity  Rest your elbow and wrist as directed by your health care provider. Try to avoid any activities that caused the problem until your health care provider says that you can do them again.  If a physical therapist teaches you exercises, do all of them as directed.  If you lift an object, lift it with your palm facing upward. This lowers the stress on your elbow. Lifestyle  If your tennis elbow is caused by sports, check your equipment and make sure that:  You are using it correctly.  It is the best fit for you.  If your tennis elbow is caused by work, take breaks frequently, if you are able. Talk with your manager about how to best perform tasks in a way that is safe.  If your tennis elbow is caused by computer use, talk with your manager about any changes that can be made to your work environment. General Instructions  If directed, apply ice to the painful area:  Put ice in a plastic bag.  Place a towel between your skin and the bag.  Leave the ice on for 20 minutes, 2-3 times per day.  Take medicines only as directed by your health care provider.  If you were given a brace, wear it as directed by your health care provider.  Keep all follow-up visits as directed by your health care provider. This is important. SEEK MEDICAL CARE IF:  Your pain does not get better with treatment.  Your pain gets worse.  You have numbness or weakness in your forearm, hand, or fingers.     This information is not intended to replace advice given to you by your health care provider. Make sure you discuss any questions you have with your health care provider.   Document Released: 06/08/2005 Document Revised: 10/23/2014 Document Reviewed: 06/04/2014 Elsevier Interactive Patient Education 2016 [place] Syndrome Carpal tunnel syndrome is a condition that causes pain in your hand and arm. The carpal tunnel is a narrow area located on the palm side of your  wrist. Repeated wrist motion or certain diseases may cause swelling within the tunnel. This swelling pinches the main nerve in the wrist (median nerve). CAUSES  This condition may be caused by:   Repeated wrist motions.  Wrist injuries.  Arthritis.  A cyst or tumor in the carpal tunnel.  Fluid buildup during pregnancy. Sometimes the cause of this condition is not known.  RISK FACTORS This condition is more likely to develop in:   People who have jobs that cause them to repeatedly move their wrists in the same motion, such as butchers and cashiers.  Women.  People with certain conditions, such as:  Diabetes.  Obesity.  An underactive thyroid (hypothyroidism).  Kidney failure. SYMPTOMS  Symptoms of this condition include:   A tingling feeling in your fingers, especially in your thumb, index, and middle fingers.  Tingling or numbness in your hand.  An aching feeling in your entire arm, especially when your wrist and elbow are bent for long periods of time.  Wrist pain that goes up your arm to your shoulder.  Pain that goes down into your palm or fingers.  A weak feeling in your hands. You may have trouble grabbing and holding items. Your symptoms may feel worse during the night.  DIAGNOSIS  This condition is diagnosed with a medical history and physical exam. You may also have tests, including:   An electromyogram (EMG). This test measures electrical signals sent by your nerves into the muscles.  X-rays. TREATMENT  Treatment for this condition includes:  Lifestyle changes. It is important to stop doing or modify the activity that caused your condition.  Physical or occupational therapy.  Medicines for pain and inflammation. This may include medicine that is injected into your wrist.  A wrist splint.  Surgery. HOME CARE INSTRUCTIONS  If You Have a Splint:  Wear it as told by your health care provider. Remove it only as told by your health care  provider.  Loosen the splint if your fingers become numb and tingle, or if they turn cold and blue.  Keep the splint clean and dry. General Instructions  Take over-the-counter and prescription medicines only as told by your health care provider.  Rest your wrist from any activity that may be causing your pain. If your condition is work related, talk to your employer about changes that can be made, such as getting a wrist pad to use while typing.  If directed, apply ice to the painful area:  Put ice in a plastic bag.  Place a towel between your skin and the bag.  Leave the ice on for 20 minutes, 2-3 times per day.  Keep all follow-up visits as told by your health care provider. This is important.  Do any exercises as told by your health care provider, physical therapist, or occupational therapist. [place] IF:   You have new symptoms.  Your pain is not controlled with medicines.  Your symptoms get worse.   This information is not intended to replace advice given to you by  your health care provider. Make sure you discuss any questions you have with your health care provider.   Document Released: 06/05/2000 Document Revised: 02/27/2015 Document Reviewed: 10/24/2014 Elsevier Interactive Patient Education [employer].

## 2016-07-13 IMAGING — CT CT CHEST W/ CM
2 of 5 series · 13 of 36 positions shown, 16 images · IV contrast (omnipaque)
Comparison: 11/09/2011

CLINICAL DATA: Follicular lymphoma.  Routine follow-up.

EXAM:
CT CHEST, ABDOMEN, AND PELVIS WITH CONTRAST
TECHNIQUE: Multidetector CT imaging of the chest, abdomen and pelvis was
performed following the standard protocol during bolus
administration of intravenous contrast.
CONTRAST:  100mL OMNIPAQUE IOHEXOL 350 MG/ML SOLN

[Series 2: cap with · axial · 0.81mm/px · z∈[-1310,-720]mm · 10 of 138 slices shown, 13 images]
[im 10/138  mediastinal]
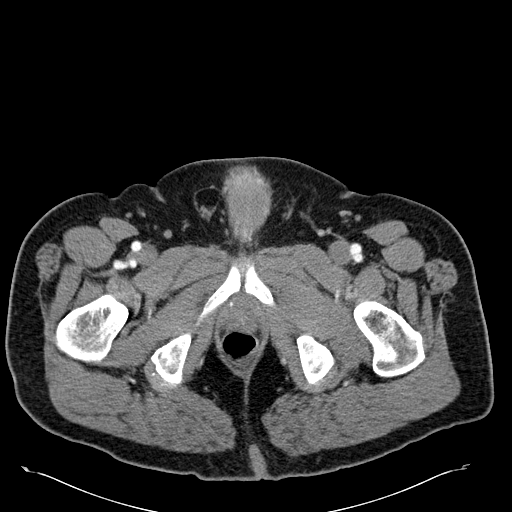
[im 10/138  lung]
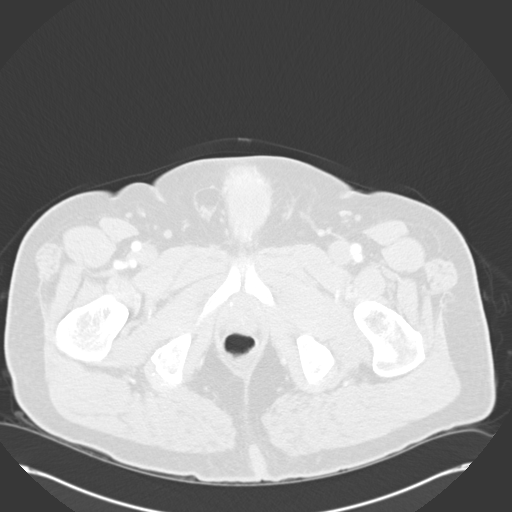
[im 28/138  lung]
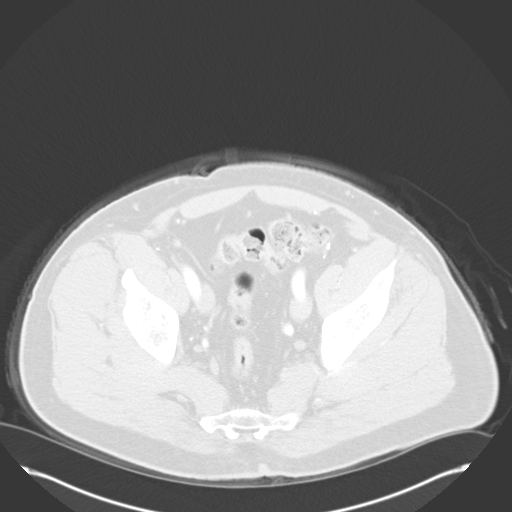
[im 37/138  lung]
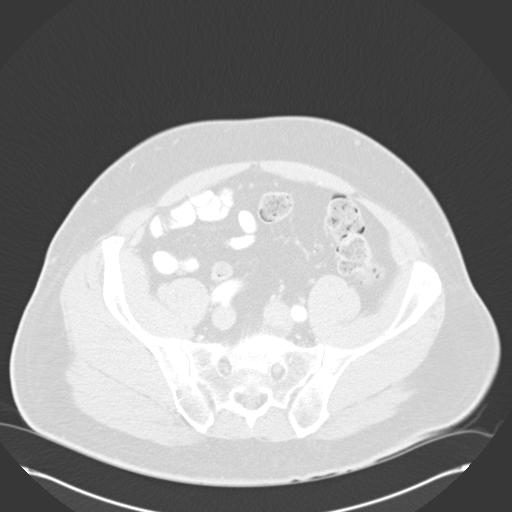
[im 46/138  lung]
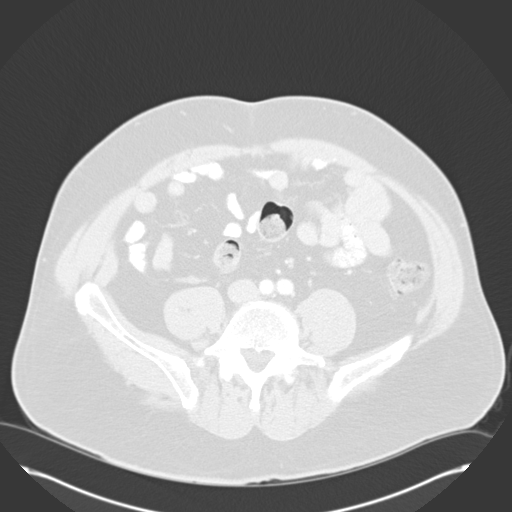
[im 64/138  mediastinal]
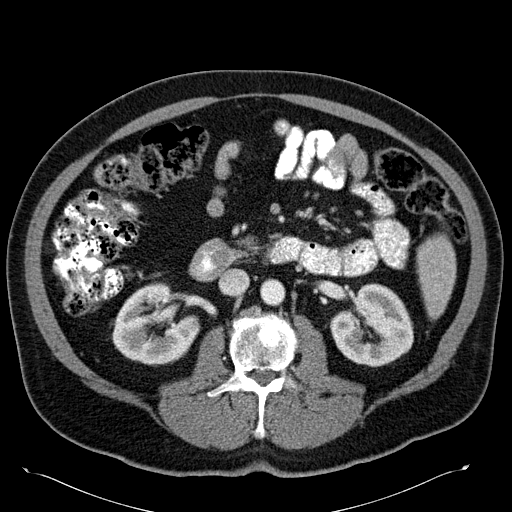
[im 64/138  lung]
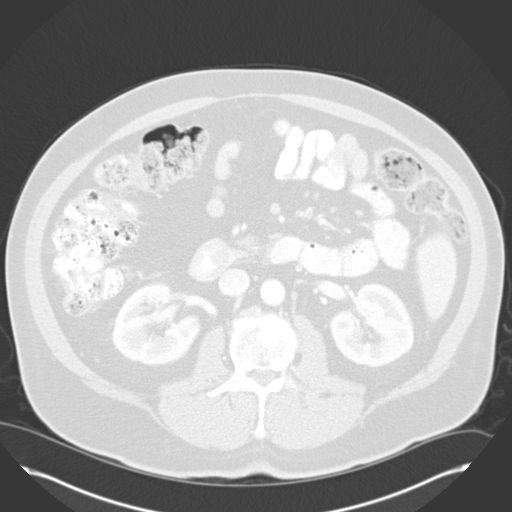
[im 74/138  lung]
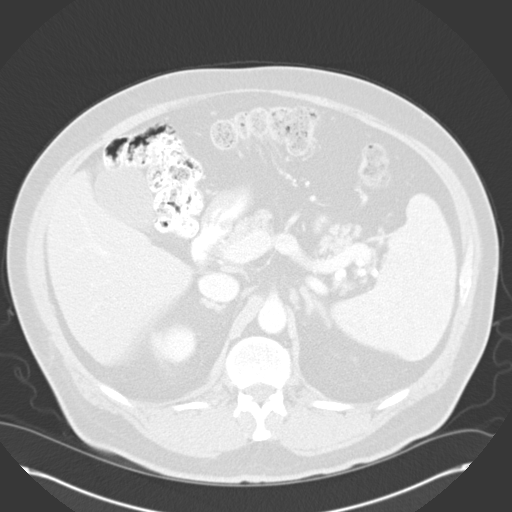
[im 92/138  lung]
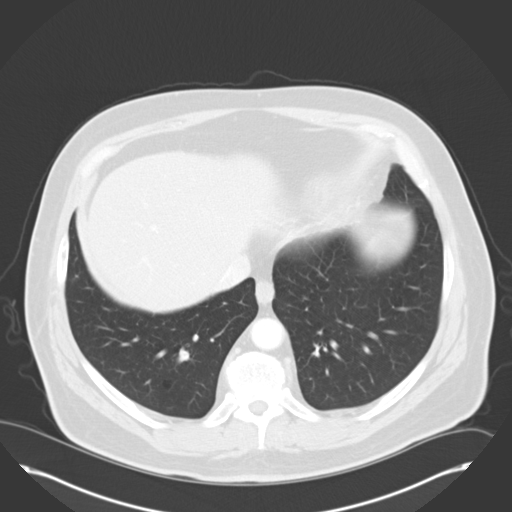
[im 101/138  lung]
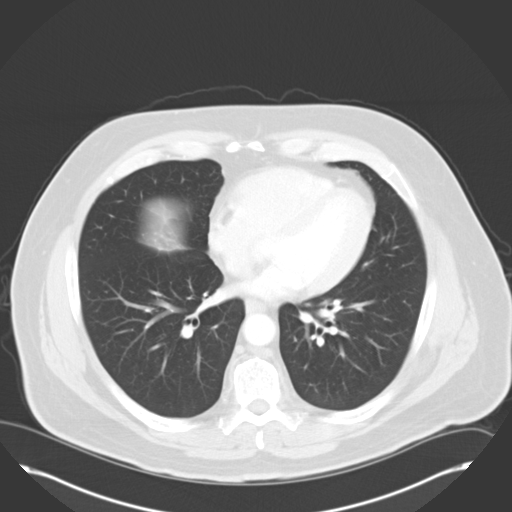
[im 110/138  mediastinal]
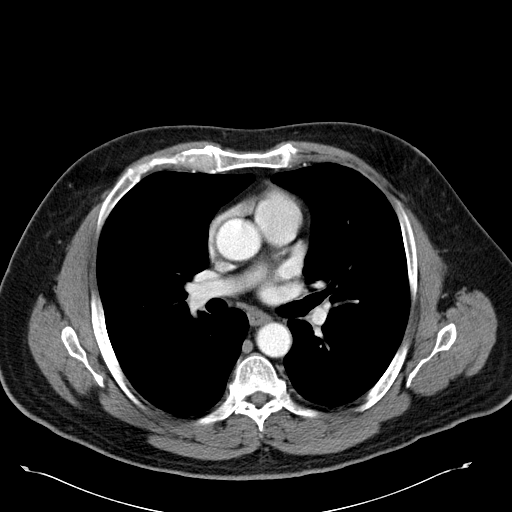
[im 110/138  lung]
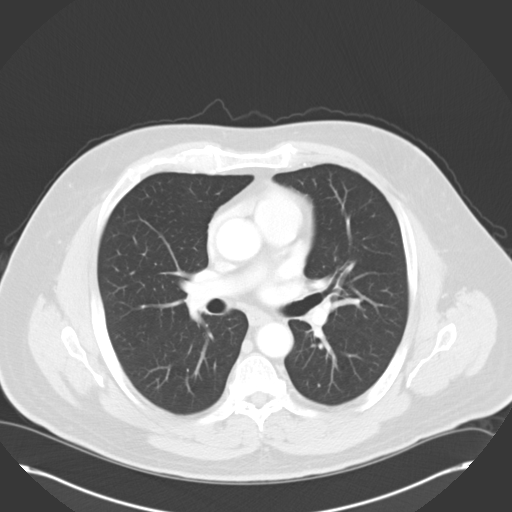
[im 128/138  lung]
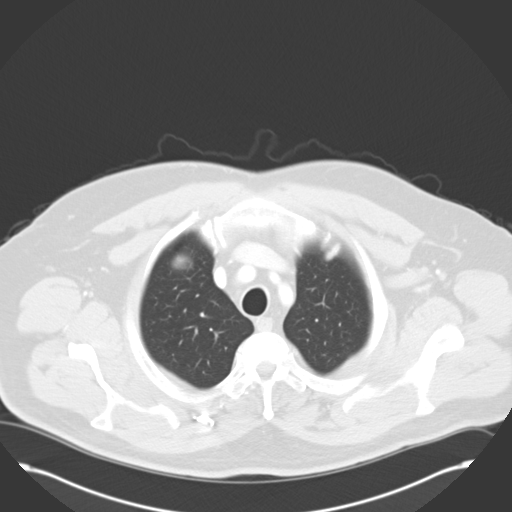

[Series 5: cor cap with cor cor · coronal · 0.79mm/px · 3 of 168 slices shown]
[im 34/168  lung]
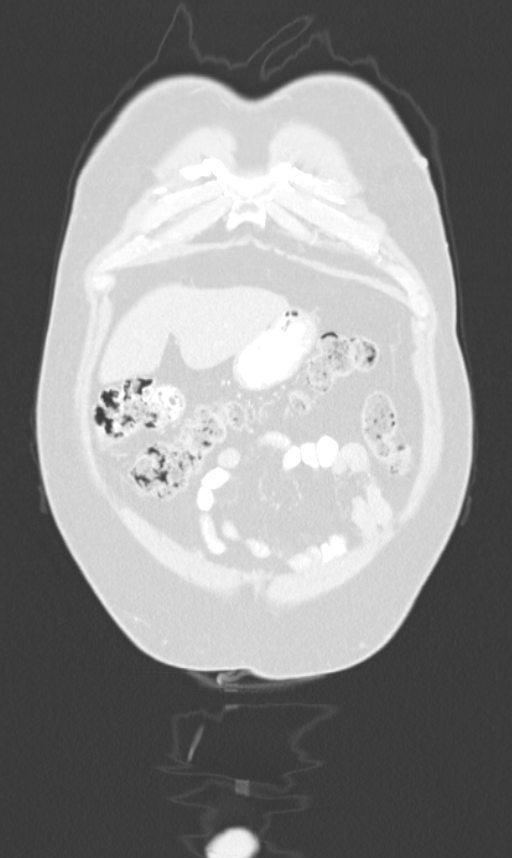
[im 67/168  lung]
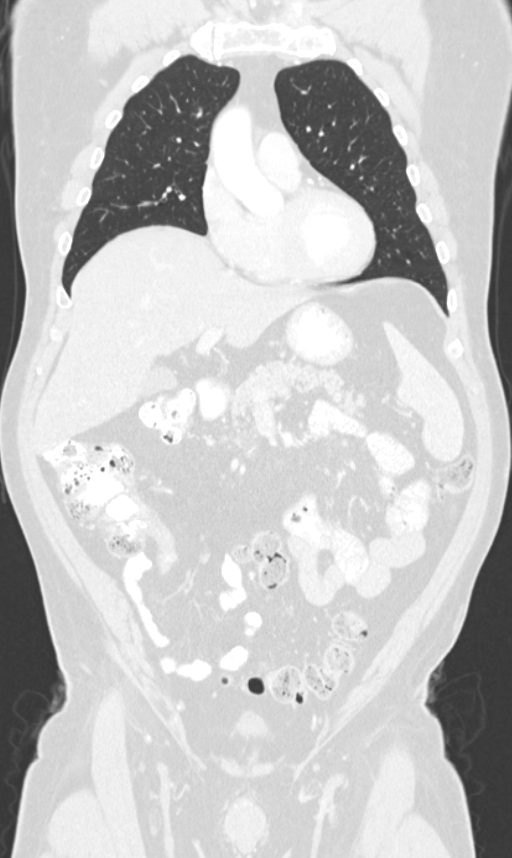
[im 101/168  lung]
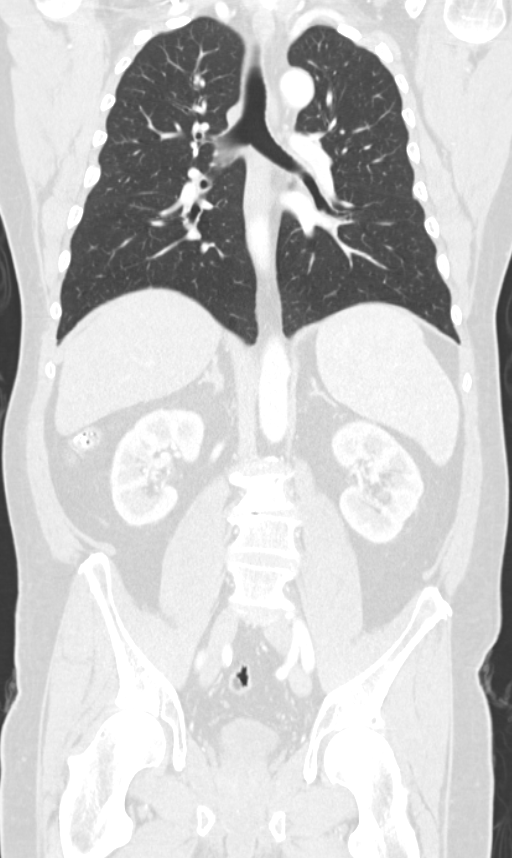

[13 of 36 positions shown; findings below may reference images not displayed]

FINDINGS: CT CHEST FINDINGS

Mediastinum: The heart size appears normal. No pericardial effusion
identified. There is no mediastinal or hilar adenopathy identified.
No axillary or supraclavicular adenopathy noted. The trachea appears
patent and is midline. Normal appearance of the esophagus.

Lungs/Pleura: No pleural effusion identified. No airspace
consolidation or atelectasis noted. No suspicious pulmonary nodule
or mass.

Musculoskeletal: There is no aggressive lytic or sclerotic bone
lesion identified.

CT ABDOMEN AND PELVIS FINDINGS

Hepatobiliary: Low-attenuation structure within the lateral segment
of left lobe measures 1.2 cm, image 54 of series 2. Unchanged from
previous exam favoring a simple cysts. The gallbladder is normal. No
biliary dilatation.

Pancreas: Negative

Spleen: The spleen has a length of 15 cm, image 93/series 5.
Unchanged from previous exam. Heterogeneous appearance of the
splenic parenchyma is again noted.

Adrenals/Urinary Tract: The adrenal glands are both normal. On
unremarkable appearance of the right kidney. The left kidney is
normal. Urinary bladder appears normal.

Stomach/Bowel: The stomach and the small bowel loops have a normal
course and caliber. No obstruction. The appendix is visualized and
appears normal. Unremarkable appearance of the colon.

Vascular/Lymphatic: Calcified atherosclerotic disease involves the
abdominal aorta. No aneurysm. No enlarged retroperitoneal or
mesenteric adenopathy. No enlarged pelvic or inguinal lymph nodes.

Reproductive: Prostate gland and seminal vesicles are unremarkable.

Other: There is no ascites or focal fluid collections within the
abdomen or pelvis.

Musculoskeletal: No aggressive lytic or sclerotic bone lesion
identified. Degenerative disc disease noted within the lower lumbar
spine.
IMPRESSION: 1. Stable CT of the chest abdomen and pelvis. No evidence for
residual or recurrent adenopathy.
2. Stable splenomegaly.
3. Aortic atherosclerosis.

## 2016-09-09 ENCOUNTER — Other Ambulatory Visit: Payer: Self-pay

## 2016-09-23 ENCOUNTER — Encounter: Payer: Self-pay | Admitting: Family Medicine

## 2016-09-23 ENCOUNTER — Ambulatory Visit (INDEPENDENT_AMBULATORY_CARE_PROVIDER_SITE_OTHER): Payer: BLUE CROSS/BLUE SHIELD | Admitting: Family Medicine

## 2016-09-23 VITALS — BP 120/70 | HR 60 | Ht 71.0 in | Wt 240.0 lb

## 2016-09-23 DIAGNOSIS — M5090 Cervical disc disorder, unspecified, unspecified cervical region: Secondary | ICD-10-CM | POA: Insufficient documentation

## 2016-09-23 DIAGNOSIS — G5602 Carpal tunnel syndrome, left upper limb: Secondary | ICD-10-CM

## 2016-09-23 DIAGNOSIS — C8589 Other specified types of non-Hodgkin lymphoma, extranodal and solid organ sites: Secondary | ICD-10-CM | POA: Diagnosis not present

## 2016-09-23 DIAGNOSIS — Z23 Encounter for immunization: Secondary | ICD-10-CM

## 2016-09-23 DIAGNOSIS — M7712 Lateral epicondylitis, left elbow: Secondary | ICD-10-CM | POA: Diagnosis not present

## 2016-09-23 DIAGNOSIS — T300 Burn of unspecified body region, unspecified degree: Secondary | ICD-10-CM

## 2016-09-23 DIAGNOSIS — I10 Essential (primary) hypertension: Secondary | ICD-10-CM

## 2016-09-23 DIAGNOSIS — G5603 Carpal tunnel syndrome, bilateral upper limbs: Secondary | ICD-10-CM | POA: Diagnosis not present

## 2016-09-23 DIAGNOSIS — K219 Gastro-esophageal reflux disease without esophagitis: Secondary | ICD-10-CM | POA: Diagnosis not present

## 2016-09-23 MED ORDER — OMEPRAZOLE 20 MG PO CPDR: 20.0000 mg | DELAYED_RELEASE_CAPSULE | Freq: Every day | ORAL | 3 refills | Status: DC

## 2016-09-23 MED ORDER — MELOXICAM 15 MG PO TABS: 15.0000 mg | ORAL_TABLET | Freq: Every day | ORAL | 3 refills | Status: DC

## 2016-09-23 MED ORDER — AMLODIPINE BESYLATE 10 MG PO TABS: 10.0000 mg | ORAL_TABLET | Freq: Every day | ORAL | 3 refills | Status: DC

## 2016-09-23 NOTE — Progress Notes (Signed)
Name: Nathan Gill   MRN: [id number]    DOB: [birth year]   Date:09/23/2016       Progress Note  Subjective  Chief Complaint  Chief Complaint  Patient presents with  . Hypertension  . Elbow Pain    wants refill on Meloxicam for this    Hypertension  This is a chronic problem. The current episode started more than 1 year ago. The problem has been waxing and waning since onset. The problem is controlled. Pertinent negatives include no anxiety, blurred vision, chest pain, headaches, malaise/fatigue, neck pain, orthopnea, palpitations, peripheral edema, PND, shortness of breath or sweats. There are no associated agents to hypertension. There are no known risk factors for coronary artery disease. Past treatments include calcium channel blockers. The current treatment provides mild improvement. There are no compliance problems.  There is no history of angina, kidney disease, CAD/MI, CVA, heart failure, left ventricular hypertrophy, PVD or retinopathy. There is no history of chronic renal disease, a hypertension causing med or renovascular disease.  Arm Pain   The incident occurred more than 1 week ago. There was no injury mechanism. The pain is present in the left elbow. The quality of the pain is described as aching. The pain is at a severity of 3/10. The pain has been fluctuating since the incident. Pertinent negatives include no chest pain, muscle weakness or tingling. Nothing aggravates the symptoms. The treatment provided mild relief.    No problem-specific Assessment & Plan notes found for this encounter.   Past Medical History:  Diagnosis Date  . Cancer ([place])   . Hypertension   . Lymphoma ([place]) 2013   chemo/radiation treatment    No past surgical history on file.  Family History  Problem Relation Age of Onset  . Lung cancer Father   . Leukemia Maternal Uncle   . Melanoma Paternal Grandfather   . Colon cancer Cousin     Social History   Social History  . Marital status:  Divorced    Spouse name: N/A  . Number of children: N/A  . Years of education: N/A   Occupational History  . Not on file.   Social History Main Topics  . Smoking status: Never Smoker  . Smokeless tobacco: Never Used  . Alcohol use Yes     Comment: average 2 cans a month at most  . Drug use: No  . Sexual activity: Not on file   Other Topics Concern  . Not on file   Social History Narrative  . No narrative on file    No Known Allergies  Outpatient Medications Prior to Visit  Medication Sig Dispense Refill  . acetaminophen (TYLENOL) 325 MG tablet Take 325 mg by mouth every 6 (six) hours as needed.    Marland Kitchen amLODipine (NORVASC) 10 MG tablet Take 10 mg by mouth.    Marland Kitchen ibuprofen (ADVIL,MOTRIN) 200 MG tablet Take 200 mg by mouth every 6 (six) hours as needed.    . meloxicam (MOBIC) 15 MG tablet Take 1 tablet (15 mg total) by mouth daily. (Patient taking differently: Take 15 mg by mouth daily. ortho) 30 tablet 2   No facility-administered medications prior to visit.     Review of Systems  Constitutional: Negative for chills, fever, malaise/fatigue and weight loss.  HENT: Negative for ear discharge, ear pain and sore throat.   Eyes: Negative for blurred vision.  Respiratory: Negative for cough, sputum production, shortness of breath and wheezing.   Cardiovascular: Negative for chest pain, palpitations, orthopnea,  leg swelling and PND.  Gastrointestinal: Negative for abdominal pain, blood in stool, constipation, diarrhea, heartburn, melena and nausea.  Genitourinary: Negative for dysuria, frequency, hematuria and urgency.  Musculoskeletal: Negative for back pain, joint pain, myalgias and neck pain.  Skin: Negative for rash.  Neurological: Negative for dizziness, tingling, sensory change, focal weakness and headaches.  Endo/Heme/Allergies: Negative for environmental allergies and polydipsia. Does not bruise/bleed easily.  Psychiatric/Behavioral: Negative for depression and suicidal  ideas. The patient is not nervous/anxious and does not have insomnia.      Objective  Vitals:   09/23/16 0927  BP: 120/70  Pulse: 60  Weight: 240 lb (108.9 kg)  Height: 5\' 11"  (1.803 m)    Physical Exam  Constitutional: He is oriented to person, place, and time and well-developed, well-nourished, and in no distress.  HENT:  Head: Normocephalic.  Right Ear: External ear normal.  Left Ear: External ear normal.  Nose: Nose normal.  Mouth/Throat: Oropharynx is clear and moist.  Eyes: Conjunctivae and EOM are normal. Pupils are equal, round, and reactive to light. Right eye exhibits no discharge. Left eye exhibits no discharge. No scleral icterus.  Neck: Normal range of motion. Neck supple. No JVD present. No tracheal deviation present. No thyromegaly present.  Cardiovascular: Normal rate, regular rhythm, normal heart sounds and intact distal pulses.  Exam reveals no gallop and no friction rub.   No murmur heard. Pulmonary/Chest: Breath sounds normal. No respiratory distress. He has no wheezes. He has no rales.  Abdominal: Soft. Bowel sounds are normal. He exhibits no mass. There is no hepatosplenomegaly. There is no tenderness. There is no rebound, no guarding and no CVA tenderness.  Musculoskeletal: Normal range of motion. He exhibits no edema or tenderness.  Lymphadenopathy:    He has no cervical adenopathy.  Neurological: He is alert and oriented to person, place, and time. He has normal sensation, normal strength, normal reflexes and intact cranial nerves. No cranial nerve deficit.  Skin: Skin is warm. No rash noted.  Psychiatric: Mood and affect normal.  Nursing note and vitals reviewed.     Assessment & Plan  Problem List Items Addressed This Visit      Cardiovascular and Mediastinum   Essential hypertension - Primary   Relevant Medications   amLODipine (NORVASC) 10 MG tablet   Other Relevant Orders   Renal function panel     Digestive   Gastroesophageal reflux  disease without esophagitis   Relevant Medications   omeprazole (PRILOSEC) 20 MG capsule     Nervous and Auditory   Bilateral carpal tunnel syndrome   Relevant Medications   meloxicam (MOBIC) 15 MG tablet     Musculoskeletal and Integument   Cervical disc disease   Relevant Medications   meloxicam (MOBIC) 15 MG tablet   Epicondylitis, lateral, left   Relevant Medications   meloxicam (MOBIC) 15 MG tablet    Other Visit Diagnoses    Carpal tunnel syndrome of left wrist       Relevant Medications   meloxicam (MOBIC) 15 MG tablet   Non-Hodgkin's lymphoma of testis (HCC)       Relevant Medications   meloxicam (MOBIC) 15 MG tablet   amLODipine (NORVASC) 10 MG tablet   Need for diphtheria-tetanus-pertussis (Tdap) vaccine       Relevant Orders   Tdap vaccine greater than or equal to 7yo IM (Completed)   Burn       hand/welding   Relevant Orders   Tdap vaccine greater than or equal to  7yo IM (Completed)      Meds ordered this encounter  Medications  . meloxicam (MOBIC) 15 MG tablet    Sig: Take 1 tablet (15 mg total) by mouth daily.    Dispense:  90 tablet    Refill:  3  . amLODipine (NORVASC) 10 MG tablet    Sig: Take 1 tablet (10 mg total) by mouth daily.    Dispense:  90 tablet    Refill:  3  . omeprazole (PRILOSEC) 20 MG capsule    Sig: Take 1 capsule (20 mg total) by mouth daily. otc    Dispense:  90 capsule    Refill:  3      Dr. Otilio Miu [facility]  09/23/16

## 2016-09-24 ENCOUNTER — Telehealth: Payer: Self-pay | Admitting: *Deleted

## 2016-09-24 LAB — RENAL FUNCTION PANEL
ALBUMIN: 4.7 g/dL (ref 3.6–4.8)
BUN/Creatinine Ratio: 24 (ref 10–24)
BUN: 20 mg/dL (ref 8–27)
CHLORIDE: 98 mmol/L (ref 96–106)
CO2: 24 mmol/L (ref 18–29)
CREATININE: 0.85 mg/dL (ref 0.76–1.27)
Calcium: 9.5 mg/dL (ref 8.6–10.2)
GFR calc non Af Amer: 95 mL/min/{1.73_m2} (ref >59)
GFR, EST AFRICAN AMERICAN: 109 mL/min/{1.73_m2} (ref >59)
Glucose: 85 mg/dL (ref 65–99)
Phosphorus: 3.6 mg/dL (ref 2.5–4.5)
Potassium: 4.2 mmol/L (ref 3.5–5.2)
Sodium: 141 mmol/L (ref 134–144)

## 2016-09-24 NOTE — Telephone Encounter (Signed)
Patient called and states that he had a missed called and he is just returning that call back. He is requesting a call back. His number is [phone]. Thank you

## 2017-01-25 NOTE — Progress Notes (Signed)
[name] OFFICE PROGRESS NOTE  Patient Care Team: Juline Patch, MD as PCP - General (Family Medicine)  Cancer Staging No matching staging information was found for the patient.   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 PET scan showed postoperative changes.  Bone marrow biopsy negative for lymphoma.  MUGA scan normal LVEF.  HBsAg/HCV Ab/HIV Ab are negative)  Patient completed Rituxan/CHOP chemotherapy x 6 cycles (from 08/13/11 - 12/29/11),  then has completed radiation to testicle area. Got 1 cycle high-dose IV Methotrexate complicated by acute renal failure, improved. Since then got intrathecal Methoterexate for CNS prophylaxis (given with cycles 3,4,5,6 chemotherapy).     Large cell lymphoma, extranodal and solid organ sites [facility])      INTERVAL HISTORY:  Nathan Gill 61 y.o.  male pleasant patient above history of Stage IE testicular lymphoma status post CHOP chemotherapy. Patient also received CNS prophylaxis with methotrexate.  Patient doing well. Denies any lumps or bumps. No nausea no vomiting. No chest pain shortness of breath cough. Patient stated that he is recommended colonoscopy by his PCP; however he wants to hold off until next year.  REVIEW OF SYSTEMS:  A complete 10 point review of system is done which is negative except mentioned above/history of present illness.   PAST MEDICAL HISTORY :  Past Medical History:  Diagnosis Date  . Cancer ([place])   . Hypertension   . Lymphoma ([place]) 2013   chemo/radiation treatment    PAST SURGICAL HISTORY :  No past surgical history on file.  FAMILY HISTORY :   Family History  Problem Relation Age of Onset  . Lung cancer Father   . Leukemia Maternal Uncle   . Melanoma Paternal Grandfather   . Colon cancer Cousin     SOCIAL HISTORY:   Social History  Substance Use  Topics  . Smoking status: Never Smoker  . Smokeless tobacco: Never Used  . Alcohol use Yes     Comment: average 2 cans a month at most    ALLERGIES:  has No Known Allergies.  MEDICATIONS:  Current Outpatient Prescriptions  Medication Sig Dispense Refill  . acetaminophen (TYLENOL) 325 MG tablet Take 325 mg by mouth every 6 (six) hours as needed.    Marland Kitchen amLODipine (NORVASC) 10 MG tablet Take 1 tablet (10 mg total) by mouth daily. 90 tablet 3  . meloxicam (MOBIC) 15 MG tablet Take 1 tablet (15 mg total) by mouth daily. 90 tablet 3  . omeprazole (PRILOSEC) 20 MG capsule Take 1 capsule (20 mg total) by mouth daily. otc 90 capsule 3   No current facility-administered medications for this visit.     PHYSICAL EXAMINATION: ECOG PERFORMANCE STATUS: 0 - Asymptomatic  BP 132/83 (BP Location: Left Arm, Patient Position: Sitting)   Pulse 69   Temp (!) 96.7 F (35.9 C) (Tympanic)   Resp 16   Wt 250 lb (113.4 kg)   BMI 34.87 kg/m   Filed Weights   01/26/17 0911  Weight: 250 lb (113.4 kg)    GENERAL: Well-nourished well-developed; Alert, no distress and comfortable.   Alone.  EYES: no pallor or icterus OROPHARYNX: no thrush or ulceration; good dentition  NECK: supple, no masses felt LYMPH:  no palpable lymphadenopathy in the cervical, axillary or inguinal regions LUNGS: clear to auscultation and  No wheeze or crackles HEART/CVS: regular rate & rhythm and no  murmurs; No lower extremity edema ABDOMEN:abdomen soft, non-tender and normal bowel sounds Musculoskeletal:no cyanosis of digits and no clubbing  PSYCH: alert & oriented x 3 with fluent speech NEURO: no focal motor/sensory deficits SKIN:  no rashes or significant lesions.   LABORATORY DATA:  I have reviewed the data as listed    Component Value Date/Time   NA 138 01/26/2017 0856   NA 141 09/23/2016 1027   NA 137 03/23/2013 0932   K 4.0 01/26/2017 0856   K 4.1 03/23/2013 0932   CL 103 01/26/2017 0856   CL 101 03/23/2013  0932   CO2 30 01/26/2017 0856   CO2 29 03/23/2013 0932   GLUCOSE 148 (H) 01/26/2017 0856   GLUCOSE 96 03/23/2013 0932   BUN 22 (H) 01/26/2017 0856   BUN 20 09/23/2016 1027   BUN 28 (H) 03/23/2013 0932   CREATININE 0.91 01/26/2017 0856   CREATININE 1.16 07/26/2014 0903   CALCIUM 8.9 01/26/2017 0856   CALCIUM 8.3 (L) 03/23/2013 0932   PROT 7.2 01/26/2017 0856   PROT 7.7 07/26/2014 0903   ALBUMIN 4.2 01/26/2017 0856   ALBUMIN 4.7 09/23/2016 1027   ALBUMIN 3.9 07/26/2014 0903   AST 26 01/26/2017 0856   AST 23 07/26/2014 0903   ALT 31 01/26/2017 0856   ALT 41 07/26/2014 0903   ALKPHOS 71 01/26/2017 0856   ALKPHOS 103 07/26/2014 0903   BILITOT 0.9 01/26/2017 0856   BILITOT 0.4 07/26/2014 0903   GFRNONAA >60 01/26/2017 0856   GFRNONAA >60 07/26/2014 0903   GFRNONAA >60 07/10/2013 0833   GFRAA >60 01/26/2017 0856   GFRAA >60 07/26/2014 0903   GFRAA >60 07/10/2013 0833    No results found for: SPEP, UPEP  Lab Results  Component Value Date   WBC 5.1 01/26/2017   NEUTROABS 3.5 01/26/2017   HGB 15.2 01/26/2017   HCT 44.1 01/26/2017   MCV 83.6 01/26/2017   PLT 182 01/26/2017      Chemistry      Component Value Date/Time   NA 138 01/26/2017 0856   NA 141 09/23/2016 1027   NA 137 03/23/2013 0932   K 4.0 01/26/2017 0856   K 4.1 03/23/2013 0932   CL 103 01/26/2017 0856   CL 101 03/23/2013 0932   CO2 30 01/26/2017 0856   CO2 29 03/23/2013 0932   BUN 22 (H) 01/26/2017 0856   BUN 20 09/23/2016 1027   BUN 28 (H) 03/23/2013 0932   CREATININE 0.91 01/26/2017 0856   CREATININE 1.16 07/26/2014 0903      Component Value Date/Time   CALCIUM 8.9 01/26/2017 0856   CALCIUM 8.3 (L) 03/23/2013 0932   ALKPHOS 71 01/26/2017 0856   ALKPHOS 103 07/26/2014 0903   AST 26 01/26/2017 0856   AST 23 07/26/2014 0903   ALT 31 01/26/2017 0856   ALT 41 07/26/2014 0903   BILITOT 0.9 01/26/2017 0856   BILITOT 0.4 07/26/2014 0903       RADIOGRAPHIC STUDIES: I have personally reviewed  the radiological images as listed and agreed with the findings in the report. No results found.   ASSESSMENT & PLAN:  Large cell lymphoma, extranodal and solid organ sites [facility]) Diffuse large B-cell lymphoma of the testis stage IE status post R CHOP chemotherapy/intrathecal chemoprophylaxis. Last/imaging- Aug 2017- CT scan chest and pelvis. # Discussed with the patient that here is no evidence of recurrence.  # Patient is likely cured of his lymphoma. Would not recommend imaging unless clinically indicated. Labs- WNL today.   #  Recommended colonoscopy- wants to do next year.   # Recommend follow-up in 1 year with labs.    Orders Placed This Encounter  Procedures  . CBC with Differential/Platelet    Standing Status:   Future    Standing Expiration Date:   01/26/2018  . Comprehensive metabolic panel    Standing Status:   Future    Standing Expiration Date:   01/26/2018  . Lactate dehydrogenase    Standing Status:   Future    Standing Expiration Date:   01/26/2018   All questions were answered. The patient knows to call the clinic with any problems, questions or concerns.      Cammie Sickle, MD 01/26/2017 11:32 AM

## 2017-01-25 NOTE — Assessment & Plan Note (Addendum)
Diffuse large B-cell lymphoma of the testis stage IE status post R CHOP chemotherapy/intrathecal chemoprophylaxis. Last/imaging- Aug 2017- CT scan chest and pelvis. # Discussed with the patient that here is no evidence of recurrence.  # Patient is likely cured of his lymphoma. Would not recommend imaging unless clinically indicated. Labs- WNL today.   # Recommended colonoscopy- wants to do next year.   # Recommend follow-up in 1 year with labs.

## 2017-01-26 ENCOUNTER — Inpatient Hospital Stay (HOSPITAL_BASED_OUTPATIENT_CLINIC_OR_DEPARTMENT_OTHER): Payer: BLUE CROSS/BLUE SHIELD | Admitting: Internal Medicine

## 2017-01-26 ENCOUNTER — Inpatient Hospital Stay: Payer: BLUE CROSS/BLUE SHIELD | Attending: Internal Medicine

## 2017-01-26 VITALS — BP 132/83 | HR 69 | Temp 96.7°F | Resp 16 | Wt 250.0 lb

## 2017-01-26 DIAGNOSIS — Z8572 Personal history of non-Hodgkin lymphomas: Secondary | ICD-10-CM | POA: Insufficient documentation

## 2017-01-26 DIAGNOSIS — Z800 Family history of malignant neoplasm of digestive organs: Secondary | ICD-10-CM | POA: Diagnosis not present

## 2017-01-26 DIAGNOSIS — Z808 Family history of malignant neoplasm of other organs or systems: Secondary | ICD-10-CM | POA: Diagnosis not present

## 2017-01-26 DIAGNOSIS — Z79899 Other long term (current) drug therapy: Secondary | ICD-10-CM | POA: Insufficient documentation

## 2017-01-26 DIAGNOSIS — Z806 Family history of leukemia: Secondary | ICD-10-CM

## 2017-01-26 DIAGNOSIS — Z801 Family history of malignant neoplasm of trachea, bronchus and lung: Secondary | ICD-10-CM | POA: Diagnosis not present

## 2017-01-26 DIAGNOSIS — Z9079 Acquired absence of other genital organ(s): Secondary | ICD-10-CM | POA: Diagnosis not present

## 2017-01-26 DIAGNOSIS — Z9221 Personal history of antineoplastic chemotherapy: Secondary | ICD-10-CM | POA: Diagnosis not present

## 2017-01-26 DIAGNOSIS — Z923 Personal history of irradiation: Secondary | ICD-10-CM | POA: Diagnosis not present

## 2017-01-26 DIAGNOSIS — I10 Essential (primary) hypertension: Secondary | ICD-10-CM | POA: Diagnosis not present

## 2017-01-26 DIAGNOSIS — C8589 Other specified types of non-Hodgkin lymphoma, extranodal and solid organ sites: Secondary | ICD-10-CM

## 2017-01-26 LAB — COMPREHENSIVE METABOLIC PANEL
ALBUMIN: 4.2 g/dL (ref 3.5–5.0)
ALT: 31 U/L (ref 17–63)
ANION GAP: 5 (ref 5–15)
AST: 26 U/L (ref 15–41)
Alkaline Phosphatase: 71 U/L (ref 38–126)
BILIRUBIN TOTAL: 0.9 mg/dL (ref 0.3–1.2)
BUN: 22 mg/dL — AB (ref 6–20)
CHLORIDE: 103 mmol/L (ref 101–111)
CO2: 30 mmol/L (ref 22–32)
Calcium: 8.9 mg/dL (ref 8.9–10.3)
Creatinine, Ser: 0.91 mg/dL (ref 0.61–1.24)
GFR calc Af Amer: >60 mL/min (ref >60)
GFR calc non Af Amer: >60 mL/min (ref >60)
GLUCOSE: 148 mg/dL — AB (ref 65–99)
POTASSIUM: 4 mmol/L (ref 3.5–5.1)
SODIUM: 138 mmol/L (ref 135–145)
Total Protein: 7.2 g/dL (ref 6.5–8.1)

## 2017-01-26 LAB — CBC WITH DIFFERENTIAL/PLATELET
BASOS ABS: 0 10*3/uL (ref 0–0.1)
BASOS PCT: 1 %
EOS ABS: 0.1 10*3/uL (ref 0–0.7)
EOS PCT: 2 %
HCT: 44.1 % (ref 40.0–52.0)
Hemoglobin: 15.2 g/dL (ref 13.0–18.0)
LYMPHS ABS: 1.1 10*3/uL (ref 1.0–3.6)
LYMPHS PCT: 21 %
MCH: 28.8 pg (ref 26.0–34.0)
MCHC: 34.4 g/dL (ref 32.0–36.0)
MCV: 83.6 fL (ref 80.0–100.0)
MONO ABS: 0.4 10*3/uL (ref 0.2–1.0)
Monocytes Relative: 8 %
Neutro Abs: 3.5 10*3/uL (ref 1.4–6.5)
Neutrophils Relative %: 68 %
PLATELETS: 182 10*3/uL (ref 150–440)
RBC: 5.27 MIL/uL (ref 4.40–5.90)
RDW: 14.6 % — AB (ref 11.5–14.5)
WBC: 5.1 10*3/uL (ref 3.8–10.6)

## 2017-01-26 LAB — LACTATE DEHYDROGENASE: LDH: 159 U/L (ref 98–192)

## 2017-07-19 ENCOUNTER — Other Ambulatory Visit: Payer: Self-pay | Admitting: Family Medicine

## 2017-07-19 DIAGNOSIS — G5602 Carpal tunnel syndrome, left upper limb: Secondary | ICD-10-CM

## 2017-07-19 DIAGNOSIS — G5603 Carpal tunnel syndrome, bilateral upper limbs: Secondary | ICD-10-CM

## 2017-07-19 DIAGNOSIS — M7712 Lateral epicondylitis, left elbow: Secondary | ICD-10-CM

## 2017-07-19 DIAGNOSIS — C8589 Other specified types of non-Hodgkin lymphoma, extranodal and solid organ sites: Secondary | ICD-10-CM

## 2017-07-19 DIAGNOSIS — I10 Essential (primary) hypertension: Secondary | ICD-10-CM

## 2017-07-19 DIAGNOSIS — M5090 Cervical disc disorder, unspecified, unspecified cervical region: Secondary | ICD-10-CM

## 2017-10-05 ENCOUNTER — Ambulatory Visit: Payer: BLUE CROSS/BLUE SHIELD | Admitting: Family Medicine

## 2017-10-05 ENCOUNTER — Encounter: Payer: Self-pay | Admitting: Family Medicine

## 2017-10-05 VITALS — BP 130/82 | HR 64 | Ht 71.0 in | Wt 247.0 lb

## 2017-10-05 DIAGNOSIS — Z1211 Encounter for screening for malignant neoplasm of colon: Secondary | ICD-10-CM | POA: Diagnosis not present

## 2017-10-05 DIAGNOSIS — M5090 Cervical disc disorder, unspecified, unspecified cervical region: Secondary | ICD-10-CM | POA: Diagnosis not present

## 2017-10-05 DIAGNOSIS — C8589 Other specified types of non-Hodgkin lymphoma, extranodal and solid organ sites: Secondary | ICD-10-CM

## 2017-10-05 DIAGNOSIS — G5603 Carpal tunnel syndrome, bilateral upper limbs: Secondary | ICD-10-CM

## 2017-10-05 DIAGNOSIS — M7712 Lateral epicondylitis, left elbow: Secondary | ICD-10-CM | POA: Diagnosis not present

## 2017-10-05 DIAGNOSIS — G5602 Carpal tunnel syndrome, left upper limb: Secondary | ICD-10-CM | POA: Diagnosis not present

## 2017-10-05 DIAGNOSIS — I10 Essential (primary) hypertension: Secondary | ICD-10-CM | POA: Diagnosis not present

## 2017-10-05 LAB — HEMOCCULT GUIAC POC 1CARD (OFFICE): Fecal Occult Blood, POC: NEGATIVE

## 2017-10-05 MED ORDER — AMLODIPINE BESYLATE 10 MG PO TABS: ORAL_TABLET | ORAL | 3 refills | Status: DC

## 2017-10-05 MED ORDER — MELOXICAM 15 MG PO TABS: 15.0000 mg | ORAL_TABLET | Freq: Every day | ORAL | 3 refills | Status: DC

## 2017-10-05 NOTE — Progress Notes (Signed)
Name: Nathan Gill   MRN: [id number]    DOB: 1955-07-23   Date:10/05/2017       Progress Note  Subjective  Chief Complaint  Chief Complaint  Patient presents with  . Hypertension  . Arthritis    takes meloxicam for this    Hypertension  This is a chronic problem. The current episode started more than 1 year ago. The problem has been gradually worsening since onset. The problem is controlled. Pertinent negatives include no anxiety, blurred vision, chest pain, headaches, malaise/fatigue, neck pain, orthopnea, palpitations, peripheral edema, PND, shortness of breath or sweats. There are no associated agents to hypertension. There are no known risk factors for coronary artery disease. Past treatments include calcium channel blockers. The current treatment provides mild improvement. There are no compliance problems.  There is no history of angina, kidney disease, CAD/MI, CVA, heart failure, left ventricular hypertrophy, PVD or retinopathy. There is no history of chronic renal disease, a hypertension causing med or renovascular disease.  Arthritis  Presents for initial visit. The disease course has been stable. He reports no pain, stiffness, joint swelling or joint warmth. Affected locations include the left elbow. His pain is at a severity of 5/10. Pertinent negatives include no diarrhea, dry eyes, dry mouth, dysuria, fatigue, fever, pain at night, pain while resting, rash, Raynaud's syndrome, uveitis or weight loss. Risk factors do not include overuse. Past treatments include nothing. The treatment provided significant relief. Compliance with prior treatments has been good.    No problem-specific Assessment & Plan notes found for this encounter.   Past Medical History:  Diagnosis Date  . Cancer ([place])   . Hypertension   . Lymphoma ([place]) 2013   chemo/radiation treatment    No past surgical history on file.  Family History  Problem Relation Age of Onset  . Lung cancer Father   . Leukemia  Maternal Uncle   . Melanoma Paternal Grandfather   . Colon cancer Cousin     Social History   Socioeconomic History  . Marital status: Divorced    Spouse name: Not on file  . Number of children: Not on file  . Years of education: Not on file  . Highest education level: Not on file  Occupational History  . Not on file  Social Needs  . Financial resource strain: Not on file  . Food insecurity:    Worry: Not on file    Inability: Not on file  . Transportation needs:    Medical: Not on file    Non-medical: Not on file  Tobacco Use  . Smoking status: Never Smoker  . Smokeless tobacco: Never Used  Substance and Sexual Activity  . Alcohol use: Yes    Comment: average 2 cans a month at most  . Drug use: No  . Sexual activity: Not on file  Lifestyle  . Physical activity:    Days per week: Not on file    Minutes per session: Not on file  . Stress: Not on file  Relationships  . Social connections:    Talks on phone: Not on file    Gets together: Not on file    Attends religious service: Not on file    Active member of club or organization: Not on file    Attends meetings of clubs or organizations: Not on file    Relationship status: Not on file  . Intimate partner violence:    Fear of current or ex partner: Not on file  Emotionally abused: Not on file    Physically abused: Not on file    Forced sexual activity: Not on file  Other Topics Concern  . Not on file  Social History Narrative  . Not on file    No Known Allergies  Outpatient Medications Prior to Visit  Medication Sig Dispense Refill  . acetaminophen (TYLENOL) 325 MG tablet Take 325 mg by mouth every 6 (six) hours as needed.    Marland Kitchen omeprazole (PRILOSEC) 20 MG capsule Take 1 capsule (20 mg total) by mouth daily. otc 90 capsule 3  . amLODipine (NORVASC) 10 MG tablet TAKE (1) TABLET BY MOUTH EVERY DAY 30 tablet 0  . meloxicam (MOBIC) 15 MG tablet Take 1 tablet (15 mg total) by mouth daily. 90 tablet 3   No  facility-administered medications prior to visit.     Review of Systems  Constitutional: Negative for chills, fatigue, fever, malaise/fatigue and weight loss.  HENT: Negative for ear discharge, ear pain and sore throat.   Eyes: Negative for blurred vision.  Respiratory: Negative for cough, sputum production, shortness of breath and wheezing.   Cardiovascular: Negative for chest pain, palpitations, orthopnea, leg swelling and PND.  Gastrointestinal: Negative for abdominal pain, blood in stool, constipation, diarrhea, heartburn, melena and nausea.  Genitourinary: Negative for dysuria, frequency, hematuria and urgency.  Musculoskeletal: Positive for arthritis. Negative for back pain, joint pain, joint swelling, myalgias, neck pain and stiffness.  Skin: Negative for rash.  Neurological: Negative for dizziness, tingling, sensory change, focal weakness and headaches.  Endo/Heme/Allergies: Negative for environmental allergies and polydipsia. Does not bruise/bleed easily.  Psychiatric/Behavioral: Negative for depression and suicidal ideas. The patient is not nervous/anxious and does not have insomnia.      Objective  Vitals:   10/05/17 0858  BP: 130/82  Pulse: 64  Weight: 247 lb (112 kg)  Height: 5\' 11"  (1.803 m)    Physical Exam  Constitutional: He is oriented to person, place, and time.  HENT:  Head: Normocephalic.  Right Ear: External ear normal.  Left Ear: External ear normal.  Nose: Nose normal.  Mouth/Throat: Oropharynx is clear and moist.  Eyes: Pupils are equal, round, and reactive to light. Conjunctivae and EOM are normal. Right eye exhibits no discharge. Left eye exhibits no discharge. No scleral icterus.  Neck: Normal range of motion. Neck supple. No JVD present. No tracheal deviation present. No thyromegaly present.  Cardiovascular: Normal rate, regular rhythm, normal heart sounds and intact distal pulses. Exam reveals no gallop and no friction rub.  No murmur  heard. Pulmonary/Chest: Breath sounds normal. No respiratory distress. He has no wheezes. He has no rales.  Abdominal: Soft. Bowel sounds are normal. He exhibits no mass. There is no hepatosplenomegaly. There is no tenderness. There is no rebound, no guarding and no CVA tenderness.  Genitourinary: Rectum normal and prostate normal.  Musculoskeletal: Normal range of motion. He exhibits no edema or tenderness.  Lymphadenopathy:    He has no cervical adenopathy.  Neurological: He is alert and oriented to person, place, and time. He has normal strength and normal reflexes. No cranial nerve deficit.  Skin: Skin is warm. No rash noted.  Nursing note and vitals reviewed.     Assessment & Plan  Problem List Items Addressed This Visit      Cardiovascular and Mediastinum   Essential hypertension - Primary   Relevant Medications   amLODipine (NORVASC) 10 MG tablet   Other Relevant Orders   Renal Function Panel  Nervous and Auditory   Bilateral carpal tunnel syndrome   Relevant Medications   meloxicam (MOBIC) 15 MG tablet     Musculoskeletal and Integument   Cervical disc disease   Relevant Medications   meloxicam (MOBIC) 15 MG tablet   Epicondylitis, lateral, left   Relevant Medications   meloxicam (MOBIC) 15 MG tablet    Other Visit Diagnoses    Carpal tunnel syndrome of left wrist       Relevant Medications   meloxicam (MOBIC) 15 MG tablet   Non-Hodgkin's lymphoma of testis (HCC)       Relevant Medications   meloxicam (MOBIC) 15 MG tablet   amLODipine (NORVASC) 10 MG tablet   Colon cancer screening       Relevant Orders   Ambulatory referral to Gastroenterology   POCT occult blood stool (Completed)      Meds ordered this encounter  Medications  . meloxicam (MOBIC) 15 MG tablet    Sig: Take 1 tablet (15 mg total) by mouth daily.    Dispense:  90 tablet    Refill:  3  . amLODipine (NORVASC) 10 MG tablet    Sig: TAKE (1) TABLET BY MOUTH EVERY DAY    Dispense:  90  tablet    Refill:  3    Needs appt      Dr. Otilio Miu [facility]  10/05/17

## 2017-10-06 LAB — RENAL FUNCTION PANEL
Albumin: 4.5 g/dL (ref 3.6–4.8)
BUN / CREAT RATIO: 20 (ref 10–24)
BUN: 16 mg/dL (ref 8–27)
CALCIUM: 9.4 mg/dL (ref 8.6–10.2)
CO2: 26 mmol/L (ref 20–29)
Chloride: 100 mmol/L (ref 96–106)
Creatinine, Ser: 0.8 mg/dL (ref 0.76–1.27)
GFR calc Af Amer: 111 mL/min/{1.73_m2} (ref >59)
GFR, EST NON AFRICAN AMERICAN: 96 mL/min/{1.73_m2} (ref >59)
Glucose: 97 mg/dL (ref 65–99)
PHOSPHORUS: 3.5 mg/dL (ref 2.5–4.5)
Potassium: 4.3 mmol/L (ref 3.5–5.2)
SODIUM: 140 mmol/L (ref 134–144)

## 2017-10-29 ENCOUNTER — Encounter: Payer: Self-pay | Admitting: *Deleted

## 2018-02-01 ENCOUNTER — Inpatient Hospital Stay: Payer: BLUE CROSS/BLUE SHIELD | Attending: Internal Medicine | Admitting: Internal Medicine

## 2018-02-01 ENCOUNTER — Inpatient Hospital Stay: Payer: BLUE CROSS/BLUE SHIELD

## 2018-02-01 VITALS — BP 138/85 | HR 69 | Temp 97.5°F | Resp 16 | Wt 239.3 lb

## 2018-02-01 DIAGNOSIS — Z9221 Personal history of antineoplastic chemotherapy: Secondary | ICD-10-CM

## 2018-02-01 DIAGNOSIS — C8589 Other specified types of non-Hodgkin lymphoma, extranodal and solid organ sites: Secondary | ICD-10-CM

## 2018-02-01 DIAGNOSIS — C8339 Diffuse large B-cell lymphoma, extranodal and solid organ sites: Secondary | ICD-10-CM

## 2018-02-01 LAB — COMPREHENSIVE METABOLIC PANEL
ALK PHOS: 70 U/L (ref 38–126)
ALT: 27 U/L (ref 0–44)
ANION GAP: 10 (ref 5–15)
AST: 25 U/L (ref 15–41)
Albumin: 4.3 g/dL (ref 3.5–5.0)
BILIRUBIN TOTAL: 0.5 mg/dL (ref 0.3–1.2)
BUN: 27 mg/dL — ABNORMAL HIGH (ref 8–23)
CALCIUM: 8.8 mg/dL — AB (ref 8.9–10.3)
CO2: 23 mmol/L (ref 22–32)
CREATININE: 1.07 mg/dL (ref 0.61–1.24)
Chloride: 107 mmol/L (ref 98–111)
Glucose, Bld: 104 mg/dL — ABNORMAL HIGH (ref 70–99)
Potassium: 4 mmol/L (ref 3.5–5.1)
SODIUM: 140 mmol/L (ref 135–145)
TOTAL PROTEIN: 7.4 g/dL (ref 6.5–8.1)

## 2018-02-01 LAB — CBC WITH DIFFERENTIAL/PLATELET
Basophils Absolute: 0 10*3/uL (ref 0–0.1)
Basophils Relative: 1 %
Eosinophils Absolute: 0.1 10*3/uL (ref 0–0.7)
Eosinophils Relative: 2 %
HCT: 44.6 % (ref 40.0–52.0)
Hemoglobin: 15.3 g/dL (ref 13.0–18.0)
Lymphocytes Relative: 23 %
Lymphs Abs: 1.1 10*3/uL (ref 1.0–3.6)
MCH: 28.9 pg (ref 26.0–34.0)
MCHC: 34.4 g/dL (ref 32.0–36.0)
MCV: 84.2 fL (ref 80.0–100.0)
Monocytes Absolute: 0.5 10*3/uL (ref 0.2–1.0)
Monocytes Relative: 10 %
Neutro Abs: 3.3 10*3/uL (ref 1.4–6.5)
Neutrophils Relative %: 64 %
Platelets: 194 10*3/uL (ref 150–440)
RBC: 5.3 MIL/uL (ref 4.40–5.90)
RDW: 14.4 % (ref 11.5–14.5)
WBC: 5 10*3/uL (ref 3.8–10.6)

## 2018-02-01 LAB — LACTATE DEHYDROGENASE: LDH: 155 U/L (ref 98–192)

## 2018-02-01 NOTE — Progress Notes (Signed)
[place] OFFICE PROGRESS NOTE  Patient Care Team: Juline Patch, MD as PCP - General (Family Medicine)  Cancer Staging No matching staging information was found for the patient.   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 PET scan showed postoperative changes.  Bone marrow biopsy negative for lymphoma.  MUGA scan normal LVEF.  HBsAg/HCV Ab/HIV Ab are negative)  Patient completed Rituxan/CHOP chemotherapy x 6 cycles (from 08/13/11 - 12/29/11),  then has completed radiation to testicle area. Got 1 cycle high-dose IV Methotrexate complicated by acute renal failure, improved. Since then got intrathecal Methoterexate for CNS prophylaxis (given with cycles 3,4,5,6 chemotherapy).     Large cell lymphoma, extranodal and solid organ sites [facility])      INTERVAL HISTORY:  Nathan Gill 62 y.o.  male pleasant patient above history of testicle lymphoma is here for follow-up.  Patient denies any new lumps or bumps.  Appetite is good.  No weight loss.  Review of Systems  Constitutional: Negative for chills, diaphoresis, fever, malaise/fatigue and weight loss.  HENT: Negative for nosebleeds and sore throat.   Eyes: Negative for double vision.  Respiratory: Negative for cough, hemoptysis, sputum production, shortness of breath and wheezing.   Cardiovascular: Negative for chest pain, palpitations, orthopnea and leg swelling.  Gastrointestinal: Negative for abdominal pain, blood in stool, constipation, diarrhea, heartburn, melena, nausea and vomiting.  Genitourinary: Negative for dysuria, frequency and urgency.  Musculoskeletal: Negative for back pain and joint pain.  Skin: Negative.  Negative for itching and rash.  Neurological: Negative for dizziness, tingling, focal weakness, weakness and headaches.  Endo/Heme/Allergies: Does not  bruise/bleed easily.  Psychiatric/Behavioral: Negative for depression. The patient is not nervous/anxious and does not have insomnia.       PAST MEDICAL HISTORY :  Past Medical History:  Diagnosis Date  . Cancer ([place])   . Hypertension   . Lymphoma ([place]) 2013   chemo/radiation treatment    PAST SURGICAL HISTORY :  No past surgical history on file.  FAMILY HISTORY :   Family History  Problem Relation Age of Onset  . Lung cancer Father   . Leukemia Maternal Uncle   . Melanoma Paternal Grandfather   . Colon cancer Cousin     SOCIAL HISTORY:   Social History   Tobacco Use  . Smoking status: Never Smoker  . Smokeless tobacco: Never Used  Substance Use Topics  . Alcohol use: Yes    Comment: average 2 cans a month at most  . Drug use: No    ALLERGIES:  has No Known Allergies.  MEDICATIONS:  Current Outpatient Medications  Medication Sig Dispense Refill  . acetaminophen (TYLENOL) 325 MG tablet Take 325 mg by mouth every 6 (six) hours as needed.    Marland Kitchen amLODipine (NORVASC) 10 MG tablet TAKE (1) TABLET BY MOUTH EVERY DAY 90 tablet 3  . meloxicam (MOBIC) 15 MG tablet Take 1 tablet (15 mg total) by mouth daily. 90 tablet 3  . omeprazole (PRILOSEC) 20 MG capsule Take 1 capsule (20 mg total) by mouth daily. otc 90 capsule 3   No current facility-administered medications for this visit.     PHYSICAL EXAMINATION: ECOG PERFORMANCE STATUS: 0 - Asymptomatic  BP 138/85 (BP Location: Left Arm, Patient Position: Sitting)   Pulse 69   Temp (!) 97.5 F (36.4 C) (Tympanic)   Resp 16  Wt 239 lb 5 oz (108.6 kg)   BMI 33.38 kg/m   Filed Weights   02/01/18 0835  Weight: 239 lb 5 oz (108.6 kg)    GENERAL: Well-nourished well-developed; Alert, no distress and comfortable.  Alone. EYES: no pallor or icterus OROPHARYNX: no thrush or ulceration; NECK: supple; no lymph nodes felt. LYMPH:  no palpable lymphadenopathy in the axillary or inguinal regions LUNGS: Decreased breath sounds  auscultation bilaterally. No wheeze or crackles HEART/CVS: regular rate & rhythm and no murmurs; No lower extremity edema ABDOMEN:abdomen soft, non-tender and normal bowel sounds. No hepatomegaly or splenomegaly.  Musculoskeletal:no cyanosis of digits and no clubbing  PSYCH: alert & oriented x 3 with fluent speech NEURO: no focal motor/sensory deficits SKIN:  no rashes or significant lesions    LABORATORY DATA:  I have reviewed the data as listed    Component Value Date/Time   NA 140 02/01/2018 0825   NA 140 10/05/2017 1005   NA 137 03/23/2013 0932   K 4.0 02/01/2018 0825   K 4.1 03/23/2013 0932   CL 107 02/01/2018 0825   CL 101 03/23/2013 0932   CO2 23 02/01/2018 0825   CO2 29 03/23/2013 0932   GLUCOSE 104 (H) 02/01/2018 0825   GLUCOSE 96 03/23/2013 0932   BUN 27 (H) 02/01/2018 0825   BUN 16 10/05/2017 1005   BUN 28 (H) 03/23/2013 0932   CREATININE 1.07 02/01/2018 0825   CREATININE 1.16 07/26/2014 0903   CALCIUM 8.8 (L) 02/01/2018 0825   CALCIUM 8.3 (L) 03/23/2013 0932   PROT 7.4 02/01/2018 0825   PROT 7.7 07/26/2014 0903   ALBUMIN 4.3 02/01/2018 0825   ALBUMIN 4.5 10/05/2017 1005   ALBUMIN 3.9 07/26/2014 0903   AST 25 02/01/2018 0825   AST 23 07/26/2014 0903   ALT 27 02/01/2018 0825   ALT 41 07/26/2014 0903   ALKPHOS 70 02/01/2018 0825   ALKPHOS 103 07/26/2014 0903   BILITOT 0.5 02/01/2018 0825   BILITOT 0.4 07/26/2014 0903   GFRNONAA >60 02/01/2018 0825   GFRNONAA >60 07/26/2014 0903   GFRNONAA >60 07/10/2013 0833   GFRAA >60 02/01/2018 0825   GFRAA >60 07/26/2014 0903   GFRAA >60 07/10/2013 0833    No results found for: SPEP, UPEP  Lab Results  Component Value Date   WBC 5.0 02/01/2018   NEUTROABS 3.3 02/01/2018   HGB 15.3 02/01/2018   HCT 44.6 02/01/2018   MCV 84.2 02/01/2018   PLT 194 02/01/2018      Chemistry      Component Value Date/Time   NA 140 02/01/2018 0825   NA 140 10/05/2017 1005   NA 137 03/23/2013 0932   K 4.0 02/01/2018 0825    K 4.1 03/23/2013 0932   CL 107 02/01/2018 0825   CL 101 03/23/2013 0932   CO2 23 02/01/2018 0825   CO2 29 03/23/2013 0932   BUN 27 (H) 02/01/2018 0825   BUN 16 10/05/2017 1005   BUN 28 (H) 03/23/2013 0932   CREATININE 1.07 02/01/2018 0825   CREATININE 1.16 07/26/2014 0903      Component Value Date/Time   CALCIUM 8.8 (L) 02/01/2018 0825   CALCIUM 8.3 (L) 03/23/2013 0932   ALKPHOS 70 02/01/2018 0825   ALKPHOS 103 07/26/2014 0903   AST 25 02/01/2018 0825   AST 23 07/26/2014 0903   ALT 27 02/01/2018 0825   ALT 41 07/26/2014 0903   BILITOT 0.5 02/01/2018 0825   BILITOT 0.4 07/26/2014 7915  RADIOGRAPHIC STUDIES: I have personally reviewed the radiological images as listed and agreed with the findings in the report. No results found.   ASSESSMENT & PLAN:  Large cell lymphoma, extranodal and solid organ sites [facility]) Diffuse large B-cell lymphoma of the testis stage IE status post R CHOP chemotherapy/intrathecal chemoprophylaxis. Last/imaging- Aug 2017- CT scan chest and pelvis.   #Clinically stable no evidence of recurrence.  Imaging only on suspicion of clinical recurrence.  # Recommended colonoscopy-   # Recommend follow-up in 1 year with labs [pt pref]   Orders Placed This Encounter  Procedures  . Lactate dehydrogenase    Standing Status:   Future    Number of Occurrences:   1    Standing Expiration Date:   03/08/2019  . CBC with Differential/Platelet    Standing Status:   Future    Number of Occurrences:   1    Standing Expiration Date:   03/08/2019  . Comprehensive metabolic panel    Standing Status:   Future    Number of Occurrences:   1    Standing Expiration Date:   03/08/2019   All questions were answered. The patient knows to call the clinic with any problems, questions or concerns.      Cammie Sickle, MD 02/08/2018 4:45 PM

## 2018-02-01 NOTE — Assessment & Plan Note (Addendum)
Diffuse large B-cell lymphoma of the testis stage IE status post R CHOP chemotherapy/intrathecal chemoprophylaxis. Last/imaging- Aug 2017- CT scan chest and pelvis.   #Clinically stable no evidence of recurrence.  Imaging only on suspicion of clinical recurrence.  # Recommended colonoscopy-   # Recommend follow-up in 1 year with labs [pt pref]

## 2018-02-15 ENCOUNTER — Ambulatory Visit: Payer: BLUE CROSS/BLUE SHIELD | Admitting: Family Medicine

## 2018-02-15 ENCOUNTER — Encounter: Payer: Self-pay | Admitting: Family Medicine

## 2018-02-15 VITALS — BP 128/80 | HR 64 | Ht 71.0 in | Wt 235.0 lb

## 2018-02-15 DIAGNOSIS — Z23 Encounter for immunization: Secondary | ICD-10-CM | POA: Diagnosis not present

## 2018-02-15 DIAGNOSIS — H2189 Other specified disorders of iris and ciliary body: Secondary | ICD-10-CM | POA: Diagnosis not present

## 2018-02-15 NOTE — Progress Notes (Signed)
Name: Nathan Gill   MRN: [id number]    DOB: [birth year]   Date:02/15/2018       Progress Note  Subjective  Chief Complaint  Chief Complaint  Patient presents with  . Hypertension  . Flu Vaccine    Hypertension  This is a chronic problem. The current episode started more than 1 year ago. The problem has been gradually improving since onset. The problem is controlled. Pertinent negatives include no anxiety, blurred vision, chest pain, headaches, malaise/fatigue, neck pain, orthopnea, palpitations, peripheral edema, PND, shortness of breath or sweats. There are no associated agents to hypertension. Past treatments include calcium channel blockers. The current treatment provides mild improvement. There are no compliance problems.  There is no history of angina, kidney disease, CAD/MI, CVA, heart failure, left ventricular hypertrophy, PVD or retinopathy. There is no history of chronic renal disease, a hypertension causing med or renovascular disease.  Eye Problem   Both eyes are affected.This is a new problem. The current episode started more than 1 month ago. The problem occurs constantly. The problem has been gradually worsening. The pain is mild. There is no known exposure to pink eye. He does not wear contacts. Associated symptoms include eye redness and itching. Pertinent negatives include no blurred vision, eye discharge, double vision, fever, foreign body sensation, nausea, photophobia, recent URI, tingling or vomiting. Associated symptoms comments: burning. He has tried nothing for the symptoms.    No problem-specific Assessment & Plan notes found for this encounter.   Past Medical History:  Diagnosis Date  . Cancer ([place])   . Hypertension   . Lymphoma ([place]) 2013   chemo/radiation treatment    History reviewed. No pertinent surgical history.  Family History  Problem Relation Age of Onset  . Lung cancer Father   . Leukemia Maternal Uncle   . Melanoma Paternal Grandfather   . Colon  cancer Cousin     Social History   Socioeconomic History  . Marital status: Divorced    Spouse name: Not on file  . Number of children: Not on file  . Years of education: Not on file  . Highest education level: Not on file  Occupational History  . Not on file  Social Needs  . Financial resource strain: Not on file  . Food insecurity:    Worry: Not on file    Inability: Not on file  . Transportation needs:    Medical: Not on file    Non-medical: Not on file  Tobacco Use  . Smoking status: Never Smoker  . Smokeless tobacco: Never Used  Substance and Sexual Activity  . Alcohol use: Yes    Comment: average 2 cans a month at most  . Drug use: No  . Sexual activity: Not on file  Lifestyle  . Physical activity:    Days per week: Not on file    Minutes per session: Not on file  . Stress: Not on file  Relationships  . Social connections:    Talks on phone: Not on file    Gets together: Not on file    Attends religious service: Not on file    Active member of club or organization: Not on file    Attends meetings of clubs or organizations: Not on file    Relationship status: Not on file  . Intimate partner violence:    Fear of current or ex partner: Not on file    Emotionally abused: Not on file    Physically abused: Not on  file    Forced sexual activity: Not on file  Other Topics Concern  . Not on file  Social History Narrative  . Not on file    No Known Allergies  Outpatient Medications Prior to Visit  Medication Sig Dispense Refill  . acetaminophen (TYLENOL) 325 MG tablet Take 325 mg by mouth every 6 (six) hours as needed.    Marland Kitchen amLODipine (NORVASC) 10 MG tablet TAKE (1) TABLET BY MOUTH EVERY DAY 90 tablet 3  . meloxicam (MOBIC) 15 MG tablet Take 1 tablet (15 mg total) by mouth daily. (Patient taking differently: Take 15 mg by mouth daily. Ortho prescribes) 90 tablet 3  . omeprazole (PRILOSEC) 20 MG capsule Take 1 capsule (20 mg total) by mouth daily. otc 90 capsule  3   No facility-administered medications prior to visit.     Review of Systems  Constitutional: Negative for chills, fever, malaise/fatigue and weight loss.  HENT: Negative for ear discharge, ear pain and sore throat.   Eyes: Positive for redness and itching. Negative for blurred vision, double vision, photophobia and discharge.  Respiratory: Negative for cough, sputum production, shortness of breath and wheezing.   Cardiovascular: Negative for chest pain, palpitations, orthopnea, leg swelling and PND.  Gastrointestinal: Negative for abdominal pain, blood in stool, constipation, diarrhea, heartburn, melena, nausea and vomiting.  Genitourinary: Negative for dysuria, frequency, hematuria and urgency.  Musculoskeletal: Negative for back pain, joint pain, myalgias and neck pain.  Skin: Negative for rash.  Neurological: Negative for dizziness, tingling, sensory change, focal weakness and headaches.  Endo/Heme/Allergies: Negative for environmental allergies and polydipsia. Does not bruise/bleed easily.  Psychiatric/Behavioral: Negative for depression and suicidal ideas. The patient is not nervous/anxious and does not have insomnia.      Objective  Vitals:   02/15/18 1130  BP: 128/80  Pulse: 64  Weight: 235 lb (106.6 kg)  Height: 5\' 11"  (1.803 m)    Physical Exam  Constitutional: He is oriented to person, place, and time.  HENT:  Head: Normocephalic.  Right Ear: External ear normal.  Left Ear: External ear normal.  Nose: Nose normal.  Mouth/Throat: Oropharynx is clear and moist.  Eyes: Pupils are equal, round, and reactive to light. Conjunctivae and EOM are normal. Right eye exhibits no discharge. Left eye exhibits no discharge. No scleral icterus.  Neck: Normal range of motion. Neck supple. No JVD present. No tracheal deviation present. No thyromegaly present.  Cardiovascular: Normal rate, regular rhythm, normal heart sounds and intact distal pulses. Exam reveals no gallop and no  friction rub.  No murmur heard. Pulmonary/Chest: Breath sounds normal. No respiratory distress. He has no wheezes. He has no rales.  Abdominal: Soft. Bowel sounds are normal. He exhibits no mass. There is no hepatosplenomegaly. There is no tenderness. There is no rebound, no guarding and no CVA tenderness.  Musculoskeletal: Normal range of motion. He exhibits no edema or tenderness.  Lymphadenopathy:    He has no cervical adenopathy.  Neurological: He is alert and oriented to person, place, and time. He has normal strength and normal reflexes. No cranial nerve deficit.  Skin: Skin is warm. No rash noted.  Nursing note and vitals reviewed.     Assessment & Plan  Problem List Items Addressed This Visit    None    Visit Diagnoses    Ciliary flush    -  Primary   Does not correspond to simple conjunctivitis. referral to opthalomology/ today at 3:00   Relevant Orders   Ambulatory referral to  Ophthalmology   Flu vaccine need       administered   Relevant Orders   Flu Vaccine QUAD 6+ mos PF IM (Fluarix Quad PF) (Completed)      No orders of the defined types were placed in this encounter.     Dr. [facility]  02/15/18

## 2018-11-07 ENCOUNTER — Other Ambulatory Visit: Payer: Self-pay | Admitting: Family Medicine

## 2018-11-07 DIAGNOSIS — M7712 Lateral epicondylitis, left elbow: Secondary | ICD-10-CM

## 2018-11-07 DIAGNOSIS — G5603 Carpal tunnel syndrome, bilateral upper limbs: Secondary | ICD-10-CM

## 2018-11-07 DIAGNOSIS — M5090 Cervical disc disorder, unspecified, unspecified cervical region: Secondary | ICD-10-CM

## 2018-11-07 DIAGNOSIS — G5602 Carpal tunnel syndrome, left upper limb: Secondary | ICD-10-CM

## 2018-11-07 DIAGNOSIS — I10 Essential (primary) hypertension: Secondary | ICD-10-CM

## 2018-11-07 DIAGNOSIS — C8589 Other specified types of non-Hodgkin lymphoma, extranodal and solid organ sites: Secondary | ICD-10-CM

## 2018-11-09 ENCOUNTER — Encounter: Payer: Self-pay | Admitting: Family Medicine

## 2018-11-09 ENCOUNTER — Ambulatory Visit (INDEPENDENT_AMBULATORY_CARE_PROVIDER_SITE_OTHER): Payer: BLUE CROSS/BLUE SHIELD | Admitting: Family Medicine

## 2018-11-09 ENCOUNTER — Other Ambulatory Visit: Payer: Self-pay

## 2018-11-09 VITALS — BP 130/80 | HR 72 | Ht 71.0 in | Wt 234.0 lb

## 2018-11-09 DIAGNOSIS — M720 Palmar fascial fibromatosis [Dupuytren]: Secondary | ICD-10-CM | POA: Diagnosis not present

## 2018-11-09 DIAGNOSIS — I7090 Unspecified atherosclerosis: Secondary | ICD-10-CM

## 2018-11-09 DIAGNOSIS — I10 Essential (primary) hypertension: Secondary | ICD-10-CM

## 2018-11-09 DIAGNOSIS — M779 Enthesopathy, unspecified: Secondary | ICD-10-CM

## 2018-11-09 DIAGNOSIS — C8589 Other specified types of non-Hodgkin lymphoma, extranodal and solid organ sites: Secondary | ICD-10-CM | POA: Diagnosis not present

## 2018-11-09 MED ORDER — MELOXICAM 15 MG PO TABS: 15.0000 mg | ORAL_TABLET | Freq: Every day | ORAL | 0 refills | Status: DC

## 2018-11-09 MED ORDER — AMLODIPINE BESYLATE 10 MG PO TABS: ORAL_TABLET | ORAL | 1 refills | Status: DC

## 2018-11-09 NOTE — Progress Notes (Signed)
Date:  11/09/2018   Name:  Nathan Gill   DOB:  [birth year]   MRN:  [id number]   Chief Complaint: Hypertension  Hypertension  This is a chronic problem. The current episode started more than 1 year ago. The problem has been gradually improving since onset. The problem is controlled. Pertinent negatives include no anxiety, blurred vision, chest pain, headaches, malaise/fatigue, neck pain, orthopnea, palpitations, peripheral edema, PND, shortness of breath or sweats. There are no associated agents to hypertension. There are no known risk factors for coronary artery disease. Past treatments include calcium channel blockers. There are no compliance problems.  There is no history of angina, kidney disease, CAD/MI, CVA, heart failure, left ventricular hypertrophy, PVD or retinopathy. There is no history of chronic renal disease, a hypertension causing med or renovascular disease.  Hand Pain   There was no injury mechanism. The pain is present in the right hand. The quality of the pain is described as aching (crepitence). The pain is moderate. The pain has been fluctuating since the incident. Pertinent negatives include no chest pain.    Review of Systems  Constitutional: Negative for chills, fever and malaise/fatigue.  HENT: Negative for drooling, ear discharge, ear pain and sore throat.   Eyes: Negative for blurred vision.  Respiratory: Negative for cough, shortness of breath and wheezing.   Cardiovascular: Negative for chest pain, palpitations, orthopnea, leg swelling and PND.  Gastrointestinal: Negative for abdominal pain, blood in stool, constipation, diarrhea and nausea.  Endocrine: Negative for polydipsia.  Genitourinary: Negative for dysuria, frequency, hematuria and urgency.  Musculoskeletal: Negative for back pain, myalgias and neck pain.  Skin: Negative for rash.  Allergic/Immunologic: Negative for environmental allergies.  Neurological: Negative for dizziness and headaches.   Hematological: Does not bruise/bleed easily.  Psychiatric/Behavioral: Negative for suicidal ideas. The patient is not nervous/anxious.     Patient Active Problem List   Diagnosis Date Noted  . Essential hypertension 09/23/2016  . Bilateral carpal tunnel syndrome 09/23/2016  . Cervical disc disease 09/23/2016  . Epicondylitis, lateral, left 09/23/2016  . Gastroesophageal reflux disease without esophagitis 09/23/2016  . Large cell lymphoma, extranodal and solid organ sites [facility]) 01/28/2016    No Known Allergies  No past surgical history on file.  Social History   Tobacco Use  . Smoking status: Never Smoker  . Smokeless tobacco: Never Used  Substance Use Topics  . Alcohol use: Yes    Comment: average 2 cans a month at most  . Drug use: No     Medication list has been reviewed and updated.  Current Meds  Medication Sig  . acetaminophen (TYLENOL) 325 MG tablet Take 325 mg by mouth every 6 (six) hours as needed.  Marland Kitchen amLODipine (NORVASC) 10 MG tablet TAKE ONE (1) TABLET BY MOUTH ONCE DAILY  . meloxicam (MOBIC) 15 MG tablet Take 1 tablet (15 mg total) by mouth daily. (Patient taking differently: Take 15 mg by mouth daily. Ortho prescribes)  . omeprazole (PRILOSEC) 20 MG capsule Take 1 capsule (20 mg total) by mouth daily. otc    PHQ 2/9 Scores 11/09/2018 02/15/2018 10/05/2017  PHQ - 2 Score 0 0 0  PHQ- 9 Score 0 - 0    BP Readings from Last 3 Encounters:  11/09/18 130/80  02/15/18 128/80  02/01/18 138/85    Physical Exam Vitals signs and nursing note reviewed.  HENT:     Head: Normocephalic.     Right Ear: External ear normal.     Left Ear:  External ear normal.     Nose: Nose normal.  Eyes:     General: No scleral icterus.       Right eye: No discharge.        Left eye: No discharge.     Conjunctiva/sclera: Conjunctivae normal.     Pupils: Pupils are equal, round, and reactive to light.  Neck:     Musculoskeletal: Normal range of motion and neck supple.      Thyroid: No thyromegaly.     Vascular: No JVD.     Trachea: No tracheal deviation.  Cardiovascular:     Rate and Rhythm: Normal rate and regular rhythm.     Pulses: Normal pulses.     Heart sounds: Normal heart sounds, S1 normal and S2 normal. No murmur. No systolic murmur. No diastolic murmur. No friction rub. No gallop. No S3 or S4 sounds.   Pulmonary:     Effort: No respiratory distress.     Breath sounds: Normal breath sounds. No wheezing or rales.  Abdominal:     General: Bowel sounds are normal.     Palpations: Abdomen is soft. There is no mass.     Tenderness: There is no abdominal tenderness. There is no guarding or rebound.  Musculoskeletal: Normal range of motion.        General: No tenderness.  Lymphadenopathy:     Cervical: No cervical adenopathy.  Skin:    General: Skin is warm.     Findings: No rash.  Neurological:     Mental Status: He is alert and oriented to person, place, and time.     Cranial Nerves: No cranial nerve deficit.     Deep Tendon Reflexes: Reflexes are normal and symmetric.     Wt Readings from Last 3 Encounters:  11/09/18 234 lb (106.1 kg)  02/15/18 235 lb (106.6 kg)  02/01/18 239 lb 5 oz (108.6 kg)    BP 130/80   Pulse 72   Ht 5\' 11"  (1.803 m)   Wt 234 lb (106.1 kg)   BMI 32.64 kg/m   Assessment and Plan:  1. Non-Hodgkin's lymphoma of testis [facility]) Patient with history of non-Hodgkin's lymphoma which is been released from cancer center we will check a lactic dehydrogenase and CBC has been checked in the past just to confirm that everything is steady with this - amLODipine (NORVASC) 10 MG tablet; TAKE ONE (1) TABLET BY MOUTH ONCE DAILY  Dispense: 90 tablet; Refill: 1 - Lactate Dehydrogenase (LDH) - CBC with Differential/Platelet  2. Essential hypertension Chronic.  Controlled.  Continue amlodipine 10 mg once a day will check a renal function panel - amLODipine (NORVASC) 10 MG tablet; TAKE ONE (1) TABLET BY MOUTH ONCE DAILY  Dispense:  90 tablet; Refill: 1 - Renal Function Panel - Lipid Panel With LDL/HDL Ratio  3. Atherosclerosis Patient was told during 1 of the [facility] scans he had some plaque possibilities.  Will check a lipid panel with ratio and approach as necessary. - Lipid Panel With LDL/HDL Ratio  4. Dupuytren's contracture of right hand Patient has swelling in in right palmar area which is consistent with Dupuytren contracture this is primarily in the index and the fifth digit area.  We will trial meloxicam first and information was given and discussed the possibility of orthopedic referral. - meloxicam (MOBIC) 15 MG tablet; Take 1 tablet (15 mg total) by mouth daily.  Dispense: 30 tablet; Refill: 0  5. tendonitis of the right hand particularly of the extensors.  Will  initiate meloxicam for this if this continues to progress we will have the x-ray.

## 2018-11-09 NOTE — Patient Instructions (Signed)
Dupuytren's Contracture Dupuytren's contracture is a condition in which tissue under the skin of the palm becomes thick. This causes one or more of the fingers to curl inward (contract) toward the palm. After a while, the fingers may not be able to straighten out. This condition affects some or all of the fingers and the palm of the hand. This condition may affect one or both hands. Dupuytren's contracture is a long-term (chronic) condition that develops (progresses) slowly over time. There is no cure, but symptoms can be managed and progression can be slowed with treatment. This condition is usually not dangerous or painful, but it can interfere with everyday tasks. What are the causes?  This condition is caused by tissue (fascia) in the palm that gets thicker and tighter. When the fascia thickens, it pulls on the cords of tissue (tendons) that control finger movement. This causes the fingers to contract. The cause of fascia thickening is not known. However, the condition is often passed along from parent to child (inherited). What increases the risk? The following factors may make you more likely to develop this condition:  Being 63 years of age or older.  Being male.  Having a family history of this condition.  Using tobacco products, including cigarettes, chewing tobacco, and e-cigarettes.  Drinking alcohol excessively.  Having diabetes.  Having a seizure disorder. What are the signs or symptoms? Early symptoms of this condition may include:  Thick, puckered skin on the hand.  One or more lumps (nodules) on the palm. Nodules may be tender when they first appear, but they are generally painless. Later symptoms of this condition may include:  Thick cords of tissue in the palm.  Fingers curled up toward the palm.  Inability to straighten the fingers into their normal position. Though this condition is usually painless, you may have discomfort when holding or grabbing  objects. How is this diagnosed? This condition is diagnosed with a physical exam, which may include:  Looking at your hands and feeling your palms. This is to check for thickened fascia and nodules.  Measuring finger motion.  Doing the [place] tabletop test. You may be asked to try to put your hand on a surface, with your palm down and your fingers straight out. How is this treated? There is no cure for this condition, but treatment can relieve discomfort and make symptoms more manageable. Treatment options may include:  Physical therapy. This can strengthen your hand and increase flexibility.  Occupational therapy. This can help you with everyday tasks that may be more difficult because of your condition.  Shots (injections). Substances may be injected into your hand, such as: ? Medicines that help to decrease swelling (corticosteroids). ? Proteins (collagenase) to weaken thick tissue. After a collagenase injection, your health care provider may stretch your fingers.  Needle aponeurotomy. A needle is pushed through the skin and into the fascia. Moving the needle against the fascia can weaken or break up the thick tissue.  Surgery. This may be needed if your condition causes discomfort or interferes with everyday activities. Physical therapy is usually needed after surgery. No treatment is guaranteed to cure this condition. Recurrence of symptoms is common. Follow these instructions at home: Hand care  Take these actions to help protect your hand from possible injury: ? Use tools that have padded grips. ? Wear protective gloves while you work with your hands. ? Avoid repetitive hand movements. General instructions  Take over-the-counter and prescription medicines only as told by your health care  provider.  Manage any other conditions that you have, such as diabetes.  If physical therapy was prescribed, do exercises as told by your health care provider.  Do not use any products  that contain nicotine or tobacco, such as cigarettes, e-cigarettes, and chewing tobacco. If you need help quitting, ask your health care provider.  If you drink alcohol: ? Limit how much you use to:  0-1 drink a day for women.  0-2 drinks a day for men. ? Be aware of how much alcohol is in your drink. In the [place] one drink equals one 12 oz bottle of beer (355 mL), one 5 oz glass of wine (148 mL), or one 1 oz glass of hard liquor (44 mL).  Keep all follow-up visits as told by your health care provider. This is important. Contact a health care provider if:  You develop new symptoms, or your symptoms get worse.  You have pain that gets worse or does not get better with medicine.  You have difficulty or discomfort with everyday tasks.  You develop numbness or tingling. Get help right away if:  You have severe pain.  Your fingers change color or become unusually cold. Summary  Dupuytren's contracture is a condition in which tissue under the skin of the palm becomes thick.  This condition is caused by tissue (fascia) that thickens. When it thickens, it pulls on the cords of tissue (tendons) that control finger movement and makes the fingers to contract.  You are more likely to develop this condition if you are a man, are over 63 years of age, have a family history of the condition, and drink a lot of alcohol.  This condition can be treated with physical and occupational therapy, injections, and surgery.  Follow instructions about how to care for your hand. Get help right away if you have severe pain or your fingers change color or become cold. This information is not intended to replace advice given to you by your health care provider. Make sure you discuss any questions you have with your health care provider. Document Released: 04/05/2009 Document Revised: 12/28/2017 Document Reviewed: 12/28/2017 Elsevier Interactive Patient Education  2019 [place]. Dupuytren's Contracture  Surgery Dupuytren's contracture surgery is a procedure to break up or remove thick tissue (fascia) in the hand. Dupuytren's contracture is a hand condition in which fascia becomes thick. This causes some fingers to curve inward (contract) toward the palm. The goal of surgery is to return the affected fingers to their normal positions and improve hand function. This procedure does not cure Dupuytren's contracture, but it may be necessary when the condition causes discomfort or interferes with everyday tasks. Tell a health care provider about:  Any allergies you have.  All medicines you are taking, including vitamins, herbs, eye drops, creams, and over-the-counter medicines.  Any problems you or family members have had with anesthetic medicines.  Any blood disorders you have.  Any surgeries you have had.  Any medical conditions you have.  Previous hand injuries you have had.  Whether you are pregnant or may be pregnant. What are the risks? Generally, this is a safe procedure. However, problems may occur, including:  Infection.  Bleeding.  Allergic reactions to medicines.  Damage to other structures or organs, such as connective tissue (tendons) or nerves in the hand. This could cause numbness or weakness in the hand.  Scarring of the hand.  Return of Dupuytren's contracture. Surgery may relieve symptoms temporarily, but they often return. What happens before  the procedure? Staying hydrated Follow instructions from your health care provider about hydration, which may include:  Up to 2 hours before the procedure - you may continue to drink clear liquids, such as water, clear fruit juice, black coffee, and plain tea.  Eating and drinking restrictions Follow instructions from your health care provider about eating and drinking, which may include:  8 hours before the procedure - stop eating heavy meals or foods, such as meat, fried foods, or fatty foods.  6 hours before the  procedure - stop eating light meals or foods, such as toast or cereal.  6 hours before the procedure - stop drinking milk or drinks that contain milk.  2 hours before the procedure - stop drinking clear liquids. Medicines Ask your health care provider about:  Changing or stopping your regular medicines. This is especially important if you are taking diabetes medicines or blood thinners.  Taking medicines such as aspirin and ibuprofen. These medicines can thin your blood. Do not take these medicines unless your health care provider tells you to take them.  Taking over-the-counter medicines, vitamins, herbs, and supplements. Tests  You may have a physical exam of your hand.  You may have a blood or urine sample taken. General instructions  Plan to have someone take you home from the hospital or clinic.  If you will be going home right after the procedure, plan to have someone with you for 24 hours.  Ask your health care provider how your surgical site will be marked or identified.  Ask your health care provider what steps will be taken to help prevent infection. These may include: ? Removing hair at the surgery site. ? Washing skin with a germ-killing soap. ? Taking antibiotic medicine. What happens during the procedure?  An IV will be inserted into one of your veins.  You will be given one or more of the following: ? A medicine to help you relax (sedative). ? A medicine to numb your hand (local anesthetic). ? A medicine that is injected into an area of your body to numb everything below the injection site (regional anesthetic). ? A medicine to make you fall asleep (general anesthetic).  One or more incisions will be made in your hand.  Thick fascia will be broken up or removed. Your surgeon will test your finger movement and try to straighten your fingers.  Skin on the hand may be removed. If this is done, skin from another place on your body (graft) will be removed and  placed over your incision. Usually, a skin graft comes from the wrist or forearm.  The incision will be closed with stitches (sutures). Skin glue or adhesive strips may also be used to close your incision.  A bandage (dressing) will be put over the incision. This may include a splint to keep the fingers in place so they do not move. The procedure may vary among health care providers and hospitals. What happens after the procedure?   You may continue to receive medicines and fluids through an IV line.  Your blood pressure, heart rate, breathing rate, and blood oxygen level will be monitored until you leave the hospital or clinic.  You may be given pain medicine to help control pain.  Do not drive for 24 hours if you were given a sedative during your procedure.  Ask your health care provider when it is safe to drive if you have a splint on your hand. Summary  Dupuytren's contracture surgery is a procedure  to break up or remove thick tissue (fascia) in the hand.  This procedure is performed when Dupuytren's contracture has interfered with everyday tasks.  Generally, this is a safe procedure. However, problems may occur, including infection, bleeding, scarring, damage to tendons or nerves, and return of the problem.  Follow all instructions before the procedure, including when to stop eating and drinking and what medicines to change or stop. Plan to have someone take you home from the hospital or clinic.  You will receive pain medicine after the procedure. Do not drive for 24 hours after the procedure. This information is not intended to replace advice given to you by your health care provider. Make sure you discuss any questions you have with your health care provider. Document Released: 04/05/2009 Document Revised: 12/28/2017 Document Reviewed: 12/28/2017 Elsevier Interactive Patient Education  2019 [employer].

## 2018-11-10 LAB — CBC WITH DIFFERENTIAL/PLATELET
Basophils Absolute: 0 10*3/uL (ref 0.0–0.2)
Basos: 1 %
EOS (ABSOLUTE): 0.1 10*3/uL (ref 0.0–0.4)
Eos: 2 %
Hematocrit: 43.6 % (ref 37.5–51.0)
Hemoglobin: 15.5 g/dL (ref 13.0–17.7)
Immature Grans (Abs): 0.1 10*3/uL (ref 0.0–0.1)
Immature Granulocytes: 1 %
Lymphocytes Absolute: 1.1 10*3/uL (ref 0.7–3.1)
Lymphs: 19 %
MCH: 28.8 pg (ref 26.6–33.0)
MCHC: 35.6 g/dL (ref 31.5–35.7)
MCV: 81 fL (ref 79–97)
Monocytes Absolute: 0.5 10*3/uL (ref 0.1–0.9)
Monocytes: 9 %
Neutrophils Absolute: 3.9 10*3/uL (ref 1.4–7.0)
Neutrophils: 68 %
Platelets: 218 10*3/uL (ref 150–450)
RBC: 5.39 x10E6/uL (ref 4.14–5.80)
RDW: 13.7 % (ref 11.6–15.4)
WBC: 5.7 10*3/uL (ref 3.4–10.8)

## 2018-11-10 LAB — RENAL FUNCTION PANEL
Albumin: 4.5 g/dL (ref 3.8–4.8)
BUN/Creatinine Ratio: 20 (ref 10–24)
BUN: 17 mg/dL (ref 8–27)
CO2: 24 mmol/L (ref 20–29)
Calcium: 9.3 mg/dL (ref 8.6–10.2)
Chloride: 104 mmol/L (ref 96–106)
Creatinine, Ser: 0.86 mg/dL (ref 0.76–1.27)
GFR calc Af Amer: 107 mL/min/{1.73_m2} (ref >59)
GFR calc non Af Amer: 93 mL/min/{1.73_m2} (ref >59)
Glucose: 112 mg/dL — ABNORMAL HIGH (ref 65–99)
Phosphorus: 3.8 mg/dL (ref 2.8–4.1)
Potassium: 4.4 mmol/L (ref 3.5–5.2)
Sodium: 142 mmol/L (ref 134–144)

## 2018-11-10 LAB — LIPID PANEL WITH LDL/HDL RATIO
Cholesterol, Total: 233 mg/dL — ABNORMAL HIGH (ref 100–199)
HDL: 44 mg/dL (ref >39)
LDL Calculated: 161 mg/dL — ABNORMAL HIGH (ref 0–99)
LDl/HDL Ratio: 3.7 ratio — ABNORMAL HIGH (ref 0.0–3.6)
Triglycerides: 138 mg/dL (ref 0–149)
VLDL Cholesterol Cal: 28 mg/dL (ref 5–40)

## 2018-11-10 LAB — LACTATE DEHYDROGENASE: LDH: 216 IU/L (ref 121–224)

## 2018-12-06 ENCOUNTER — Other Ambulatory Visit: Payer: Self-pay | Admitting: Family Medicine

## 2018-12-06 DIAGNOSIS — M720 Palmar fascial fibromatosis [Dupuytren]: Secondary | ICD-10-CM

## 2018-12-06 DIAGNOSIS — M779 Enthesopathy, unspecified: Secondary | ICD-10-CM

## 2018-12-09 ENCOUNTER — Other Ambulatory Visit: Payer: Self-pay

## 2018-12-09 ENCOUNTER — Ambulatory Visit (INDEPENDENT_AMBULATORY_CARE_PROVIDER_SITE_OTHER): Payer: BLUE CROSS/BLUE SHIELD | Admitting: Family Medicine

## 2018-12-09 ENCOUNTER — Encounter: Payer: Self-pay | Admitting: Family Medicine

## 2018-12-09 VITALS — BP 120/72 | HR 80 | Ht 71.0 in | Wt 235.0 lb

## 2018-12-09 DIAGNOSIS — S46211A Strain of muscle, fascia and tendon of other parts of biceps, right arm, initial encounter: Secondary | ICD-10-CM | POA: Diagnosis not present

## 2018-12-09 NOTE — Progress Notes (Signed)
Date:  12/09/2018   Name:  Nathan Gill   DOB:  [birth year]   MRN:  [id number]   Chief Complaint: Arm Pain (thinks he has a "pinched nerve" causing arm pain and "severe biceps cramp" R) arm only)  Arm Pain  The incident occurred 12 to 24 hours ago. The incident occurred in the yard. The injury mechanism was repetitive motion (composting). The pain is present in the upper right arm. The quality of the pain is described as aching. The pain is at a severity of 9/10. The pain is severe. The pain has been improving since the incident. Pertinent negatives include no chest pain, muscle weakness, numbness or tingling. Nothing aggravates the symptoms. He has tried nothing for the symptoms.    Review of Systems  Constitutional: Negative for chills and fever.  HENT: Negative for drooling, ear discharge, ear pain and sore throat.   Respiratory: Negative for cough, shortness of breath and wheezing.   Cardiovascular: Negative for chest pain, palpitations and leg swelling.  Gastrointestinal: Negative for abdominal pain, blood in stool, constipation, diarrhea and nausea.  Endocrine: Negative for polydipsia.  Genitourinary: Negative for dysuria, frequency, hematuria and urgency.  Musculoskeletal: Negative for back pain, myalgias and neck pain.  Skin: Negative for rash.  Allergic/Immunologic: Negative for environmental allergies.  Neurological: Negative for dizziness, tingling, numbness and headaches.  Hematological: Does not bruise/bleed easily.  Psychiatric/Behavioral: Negative for suicidal ideas. The patient is not nervous/anxious.     Patient Active Problem List   Diagnosis Date Noted  . Essential hypertension 09/23/2016  . Bilateral carpal tunnel syndrome 09/23/2016  . Cervical disc disease 09/23/2016  . Epicondylitis, lateral, left 09/23/2016  . Gastroesophageal reflux disease without esophagitis 09/23/2016  . Large cell lymphoma, extranodal and solid organ sites [facility]) 01/28/2016    No  Known Allergies  No past surgical history on file.  Social History   Tobacco Use  . Smoking status: Never Smoker  . Smokeless tobacco: Never Used  Substance Use Topics  . Alcohol use: Yes    Comment: average 2 cans a month at most  . Drug use: No     Medication list has been reviewed and updated.  Current Meds  Medication Sig  . acetaminophen (TYLENOL) 325 MG tablet Take 325 mg by mouth every 6 (six) hours as needed.  Marland Kitchen amLODipine (NORVASC) 10 MG tablet TAKE ONE (1) TABLET BY MOUTH ONCE DAILY  . meloxicam (MOBIC) 15 MG tablet Take 1 tablet (15 mg total) by mouth daily. (Patient taking differently: Take 15 mg by mouth daily. Ortho prescribes)  . omeprazole (PRILOSEC) 20 MG capsule Take 1 capsule (20 mg total) by mouth daily. otc    PHQ 2/9 Scores 11/09/2018 02/15/2018 10/05/2017  PHQ - 2 Score 0 0 0  PHQ- 9 Score 0 - 0    BP Readings from Last 3 Encounters:  12/09/18 120/72  11/09/18 130/80  02/15/18 128/80    Physical Exam Vitals signs and nursing note reviewed.  HENT:     Head: Normocephalic.     Right Ear: External ear normal.     Left Ear: External ear normal.     Nose: Nose normal.  Eyes:     General: No scleral icterus.       Right eye: No discharge.        Left eye: No discharge.     Conjunctiva/sclera: Conjunctivae normal.     Pupils: Pupils are equal, round, and reactive to light.  Neck:  Musculoskeletal: Normal range of motion and neck supple.     Thyroid: No thyromegaly.     Vascular: No JVD.     Trachea: No tracheal deviation.  Cardiovascular:     Rate and Rhythm: Normal rate and regular rhythm.     Heart sounds: Normal heart sounds. No murmur. No friction rub. No gallop.   Pulmonary:     Effort: No respiratory distress.     Breath sounds: Normal breath sounds. No wheezing or rales.  Abdominal:     General: Bowel sounds are normal.     Palpations: Abdomen is soft. There is no mass.     Tenderness: There is no abdominal tenderness. There is no  guarding or rebound.  Musculoskeletal:     Right shoulder: He exhibits decreased range of motion, tenderness and deformity.  Lymphadenopathy:     Cervical: No cervical adenopathy.  Skin:    General: Skin is warm.     Findings: No rash.  Neurological:     Mental Status: He is alert and oriented to person, place, and time.     Cranial Nerves: No cranial nerve deficit.     Deep Tendon Reflexes: Reflexes are normal and symmetric.     Wt Readings from Last 3 Encounters:  12/09/18 235 lb (106.6 kg)  11/09/18 234 lb (106.1 kg)  02/15/18 235 lb (106.6 kg)    BP 120/72   Pulse 80   Ht 5\' 11"  (1.803 m)   Wt 235 lb (106.6 kg)   BMI 32.78 kg/m   Assessment and Plan: 1. Biceps tendon rupture, proximal, right, initial encounter Patient had what he describes as the worst pain ever cramp in his right biceps.  On exam there is a defect in the sulcus of where 1 of the tendons is supposed to be and a bunching up of the biceps which is suggestive that he may have ruptured his biceps tendon and the resultant pain from that patient is sore there now with some mild tenderness but the defect is still noticeable.  Patient will continue meloxicam and we will refer to orthopedics for evaluation perhaps ultrasound to verify the there is been a complete rupture I have cautioned about that this may be a partial and that any further activity of a certain nature may take it the rest of the direction or involve the other proximal tendon. - Ambulatory referral to Orthopedic Surgery

## 2018-12-09 NOTE — Patient Instructions (Signed)
Proximal Biceps Tendon Disruption    The proximal biceps tendon is a strong cord of tissue that connects the biceps muscle-which is the muscle on the front of the upper arm-to the shoulder blade. A proximal biceps tendon disruption can include a partial or complete tear of the tendon near where it connects to the bone of the shoulder.   This injury can interfere with your ability to lift your arm in front of your body, stabilize your shoulder, bend your elbow, and turn your hand palm-up (supination).  What are the causes?  This condition happens when too much force is placed on the tendon. This excess force may be caused by:   The elbow being suddenly straightened from a bent position because of an external force. This could happen, for example, while catching a heavy weight or being pulled when waterskiing.   Wear and tear from physical activity.   Breaking a fall with your hand.  What increases the risk?  The following factors may make you more likely to develop this condition:   Playing contact sports.   Doing activities or sports that involve throwing or overhead movements, such as racket sports, gymnastics, or baseball.   Doing activities or sports that involve putting sudden force on the arm, such as weightlifting or waterskiing.   Having a weakened tendon because of:  ? Long-lasting (chronic) biceps tendinitis.  ? Certain medical conditions, such as diabetes or rheumatoid arthritis.  ? Repeated corticosteroid use.  ? Repetitive overhead movements.  What are the signs or symptoms?  Symptoms of this condition may include:   Sudden sharp pain in the front of the shoulder. Pain may get worse during certain movements, such as:  ? Lifting or carrying objects.  ? Straightening the elbow.  ? Throwing or using overhead movements.   Inflammation or a feeling of unusual warmth on the front of the shoulder.   Painful tightening (spasm) of the biceps muscle.   A bulge on the inside of the upper arm when the  elbow is bent.   Bruising in the shoulder or upper arm. This may develop 24-48 hours after the tendon is injured.   Limited range of motion of the shoulder and elbow.   Weakness in the elbow and forearm when:  ? Bending the elbow.  ? Rotating the wrist.  How is this diagnosed?  This condition may be diagnosed based on:   Your symptoms and medical history.   A physical exam. Your health care provider may test the strength and range of motion of your shoulder and elbow.   Imaging tests, such as:  ? X-rays.  ? MRI.  ? Ultrasound.  How is this treated?  Treatment depends on the severity of the condition. Treatment may include:   Medicines to help relieve pain and inflammation.   Resting and icing the injured area.   Avoiding certain activities that put stress on your shoulder.   Physical therapy.   Surgery to repair the tear. This may be needed if nonsurgical treatments do not improve your condition.   One or more injections of medicines (corticosteroids) into your upper arm to help reduce inflammation. This treatment is rare.  Follow these instructions at home:  Managing pain, stiffness, and swelling          If directed, put ice on the injured area:  ? Put ice in a plastic bag.  ? Place a towel between your skin and the bag.  ? Leave the ice on   the heat source. ? Leave the heat on for 20-30 minutes. ? Remove the heat if your skin turns bright red. This is especially important if you are unable to feel pain, heat, or cold. You may have a greater risk of getting burned.  Move your fingers often to avoid stiffness and to lessen swelling.  Raise (elevate) the injured area while you are sitting or lying down.  Activity  Return to your normal activities as told by your health care provider. Ask your health care provider what activities are safe for you.  Avoid activities that cause pain or make your condition worse.  Do not lift anything that is heavier than 10 lb (4.5 kg), or the limit that you are told, until your health care provider says that it is safe.  Do exercises as told by your physical therapist or health care provider. General instructions  Take over-the-counter and prescription medicines only as told by your health care provider.  Do not use any products that contain nicotine or tobacco, such as cigarettes and e-cigarettes. If you need help quitting, ask your health care provider.  Keep all follow-up visits as told by your health care provider. This is important. How is this prevented? Take these steps to help prevent reinjury:  Warm up and stretch before being active.  Cool down and stretch after being active.  Give your body time to rest between periods of activity.  Make sure you use equipment that fits you.  Be safe and responsible while being active. This will help you avoid falls.  Maintain physical fitness, including strength and flexibility. Contact a health care provider if:  You have symptoms that get worse or do not get better after 2 weeks of treatment.  You develop new symptoms. Get help right away if:  You have severe pain.  You develop pain or numbness in your hand.  Your hand feels unusually cold.  Your fingernails turn a dark color, such as blue or gray. Summary  A proximal biceps tendon disruption can include a partial or complete tear of the tendon near where it connects to the bone of the shoulder.  This condition happens when too much force is placed on the tendon.  Treatment may include resting and icing the injured area.  Avoid activities that cause pain or make your condition worse. This information is not intended to replace advice  given to you by your health care provider. Make sure you discuss any questions you have with your health care provider. Document Released: 06/08/2005 Document Revised: 11/01/2017 Document Reviewed: 11/01/2017 Elsevier Interactive Patient Education  2019 [employer]. Leg Cramps Leg cramps occur when one or more muscles tighten and you have no control over this tightening (involuntary muscle contraction). Muscle cramps can develop in any muscle, but the most common place is in the calf muscles of the leg. Those cramps can occur during exercise or when you are at rest. Leg cramps are painful, and they may last for a few seconds to a few minutes. Cramps may return several times before they finally stop. Usually, leg cramps are not caused by a serious medical problem. In many cases, the cause is not known. Some common causes include:  Excessive physical effort (overexertion), such as during intense exercise.  Overuse from repetitive motions, or doing the same thing over and over.  Staying in a certain position for a long period of time.  Improper preparation, form, or technique while performing a sport or an activity.  Dehydration.  Injury.  Side effects of certain medicines.  Abnormally low levels of minerals in your blood (electrolytes), especially potassium and calcium. This could result from: ? Pregnancy. ? Taking diuretic medicines. Follow these instructions at home: Eating and drinking  Drink enough fluid to keep your urine pale yellow. Staying hydrated may help prevent cramps.  Eat a healthy diet that includes plenty of nutrients to help your muscles function. A healthy diet includes fruits and vegetables, lean protein, whole grains, and low-fat or nonfat dairy products. Managing pain, stiffness, and swelling      Try massaging, stretching, and relaxing the affected muscle. Do this for several minutes at a time.  If directed, put ice on areas that are sore or painful after a  cramp: ? Put ice in a plastic bag. ? Place a towel between your skin and the bag. ? Leave the ice on for 20 minutes, 2-3 times a day.  If directed, apply heat to muscles that are tense or tight. Do this before you exercise, or as often as told by your health care provider. Use the heat source that your health care provider recommends, such as a moist heat pack or a heating pad. ? Place a towel between your skin and the heat source. ? Leave the heat on for 20-30 minutes. ? Remove the heat if your skin turns bright red. This is especially important if you are unable to feel pain, heat, or cold. You may have a greater risk of getting burned.  Try taking hot showers or baths to help relax tight muscles. General instructions  If you are having frequent leg cramps, avoid intense exercise for several days.  Take over-the-counter and prescription medicines only as told by your health care provider.  Keep all follow-up visits as told by your health care provider. This is important. Contact a health care provider if:  Your leg cramps get more severe or more frequent, or they do not improve over time.  Your foot becomes cold, numb, or blue. Summary  Muscle cramps can develop in any muscle, but the most common place is in the calf muscles of the leg.  Leg cramps are painful, and they may last for a few seconds to a few minutes.  Usually, leg cramps are not caused by a serious medical problem. Often, the cause is not known.  Stay hydrated and take over-the-counter and prescription medicines only as told by your health care provider. This information is not intended to replace advice given to you by your health care provider. Make sure you discuss any questions you have with your health care provider. Document Released: 07/16/2004 Document Revised: 03/18/2017 Document Reviewed: 03/18/2017 Elsevier Interactive Patient Education  2019 [employer] Muscle Cramps and Spasms Muscle cramps and  spasms occur when a muscle or muscles tighten and you have no control over this tightening (involuntary muscle contraction). They are a common problem and can develop in any muscle. The most common place is in the calf muscles of the leg. Muscle cramps and muscle spasms are both involuntary muscle contractions, but there are some differences between the two:  Muscle cramps are painful. They come and go and may last for a few seconds or up to 15 minutes. Muscle cramps are often more forceful and last longer than muscle spasms.  Muscle spasms may or may not be painful. They may also last just a few seconds or much longer. Certain medical conditions, such as diabetes or Parkinson's disease, can make it more likely  to develop cramps or spasms. However, cramps or spasms are usually not caused by a serious underlying problem. Common causes include:  Doing more physical work or exercise than your body is ready for (overexertion).  Overuse from repeating certain movements too many times.  Remaining in a certain position for a long period of time.  Improper preparation, form, or technique while playing a sport or doing an activity.  Dehydration.  Injury.  Side effects of some medicines.  Abnormally low levels of the salts and minerals in your blood (electrolytes), especially potassium and calcium. This could happen if you are taking water pills (diuretics) or if you are pregnant. In many cases, the cause of muscle cramps or spasms is not known. Follow these instructions at home: Managing pain and stiffness      Try massaging, stretching, and relaxing the affected muscle. Do this for several minutes at a time.  If directed, apply heat to tight or tense muscles as often as told by your health care provider. Use the heat source that your health care provider recommends, such as a moist heat pack or a heating pad. ? Place a towel between your skin and the heat source. ? Leave the heat on for  20-30 minutes. ? Remove the heat if your skin turns bright red. This is especially important if you are unable to feel pain, heat, or cold. You may have a greater risk of getting burned.  If directed, put ice on the affected area. This may help if you are sore or have pain after a cramp or spasm. ? Put ice in a plastic bag. ? Place a towel between your skin and the bag. ? Leavethe ice on for 20 minutes, 2-3 times a day.  Try taking hot showers or baths to help relax tight muscles. Eating and drinking  Drink enough fluid to keep your urine pale yellow. Staying well hydrated may help prevent cramps or spasms.  Eat a healthy diet that includes plenty of nutrients to help your muscles function. A healthy diet includes fruits and vegetables, lean protein, whole grains, and low-fat or nonfat dairy products. General instructions  If you are having frequent cramps, avoid intense exercise for several days.  Take over-the-counter and prescription medicines only as told by your health care provider.  Pay attention to any changes in your symptoms.  Keep all follow-up visits as told by your health care provider. This is important. Contact a health care provider if:  Your cramps or spasms get more severe or happen more often.  Your cramps or spasms do not improve over time. Summary  Muscle cramps and spasms occur when a muscle or muscles tighten and you have no control over this tightening (involuntary muscle contraction).  The most common place for cramps or spasms to occur is in the calf muscles of the leg.  Massaging, stretching, and relaxing the affected muscle may relieve the cramp or spasm.  Drink enough fluid to keep your urine pale yellow. Staying well hydrated may help prevent cramps or spasms. This information is not intended to replace advice given to you by your health care provider. Make sure you discuss any questions you have with your health care provider. Document Released:  11/28/2001 Document Revised: 11/01/2017 Document Reviewed: 11/01/2017 Elsevier Interactive Patient Education  2019 [employer].

## 2019-01-06 ENCOUNTER — Other Ambulatory Visit: Payer: Self-pay | Admitting: Family Medicine

## 2019-01-06 DIAGNOSIS — G5602 Carpal tunnel syndrome, left upper limb: Secondary | ICD-10-CM

## 2019-01-06 DIAGNOSIS — M7712 Lateral epicondylitis, left elbow: Secondary | ICD-10-CM

## 2019-01-06 DIAGNOSIS — M5090 Cervical disc disorder, unspecified, unspecified cervical region: Secondary | ICD-10-CM

## 2019-01-06 DIAGNOSIS — G5603 Carpal tunnel syndrome, bilateral upper limbs: Secondary | ICD-10-CM

## 2019-01-31 ENCOUNTER — Other Ambulatory Visit: Payer: Self-pay

## 2019-01-31 DIAGNOSIS — C8589 Other specified types of non-Hodgkin lymphoma, extranodal and solid organ sites: Secondary | ICD-10-CM

## 2019-02-06 ENCOUNTER — Other Ambulatory Visit: Payer: Self-pay | Admitting: Family Medicine

## 2019-02-06 DIAGNOSIS — M5090 Cervical disc disorder, unspecified, unspecified cervical region: Secondary | ICD-10-CM

## 2019-02-06 DIAGNOSIS — M7712 Lateral epicondylitis, left elbow: Secondary | ICD-10-CM

## 2019-02-06 DIAGNOSIS — G5603 Carpal tunnel syndrome, bilateral upper limbs: Secondary | ICD-10-CM

## 2019-02-06 DIAGNOSIS — G5602 Carpal tunnel syndrome, left upper limb: Secondary | ICD-10-CM

## 2019-02-07 ENCOUNTER — Ambulatory Visit: Payer: BLUE CROSS/BLUE SHIELD | Admitting: Internal Medicine

## 2019-02-07 ENCOUNTER — Other Ambulatory Visit: Payer: BLUE CROSS/BLUE SHIELD

## 2019-03-06 ENCOUNTER — Other Ambulatory Visit: Payer: Self-pay | Admitting: Family Medicine

## 2019-03-06 DIAGNOSIS — I10 Essential (primary) hypertension: Secondary | ICD-10-CM

## 2019-03-06 DIAGNOSIS — C8589 Other specified types of non-Hodgkin lymphoma, extranodal and solid organ sites: Secondary | ICD-10-CM

## 2019-05-11 ENCOUNTER — Other Ambulatory Visit: Payer: Self-pay | Admitting: Family Medicine

## 2019-05-11 DIAGNOSIS — M7712 Lateral epicondylitis, left elbow: Secondary | ICD-10-CM

## 2019-05-11 DIAGNOSIS — M5090 Cervical disc disorder, unspecified, unspecified cervical region: Secondary | ICD-10-CM

## 2019-05-11 DIAGNOSIS — G5603 Carpal tunnel syndrome, bilateral upper limbs: Secondary | ICD-10-CM

## 2019-05-11 DIAGNOSIS — G5602 Carpal tunnel syndrome, left upper limb: Secondary | ICD-10-CM

## 2019-06-10 ENCOUNTER — Other Ambulatory Visit: Payer: Self-pay | Admitting: Family Medicine

## 2019-06-10 DIAGNOSIS — I10 Essential (primary) hypertension: Secondary | ICD-10-CM

## 2019-06-10 DIAGNOSIS — G5603 Carpal tunnel syndrome, bilateral upper limbs: Secondary | ICD-10-CM

## 2019-06-10 DIAGNOSIS — C8589 Other specified types of non-Hodgkin lymphoma, extranodal and solid organ sites: Secondary | ICD-10-CM

## 2019-06-10 DIAGNOSIS — G5602 Carpal tunnel syndrome, left upper limb: Secondary | ICD-10-CM

## 2019-06-10 DIAGNOSIS — M7712 Lateral epicondylitis, left elbow: Secondary | ICD-10-CM

## 2019-06-10 DIAGNOSIS — M5090 Cervical disc disorder, unspecified, unspecified cervical region: Secondary | ICD-10-CM

## 2019-06-22 ENCOUNTER — Other Ambulatory Visit: Payer: Self-pay

## 2019-06-22 ENCOUNTER — Ambulatory Visit (INDEPENDENT_AMBULATORY_CARE_PROVIDER_SITE_OTHER): Payer: BLUE CROSS/BLUE SHIELD | Admitting: Family Medicine

## 2019-06-22 ENCOUNTER — Inpatient Hospital Stay: Payer: BLUE CROSS/BLUE SHIELD | Attending: Hematology and Oncology

## 2019-06-22 ENCOUNTER — Encounter: Payer: Self-pay | Admitting: Family Medicine

## 2019-06-22 VITALS — BP 136/80 | HR 80 | Ht 71.0 in | Wt 249.0 lb

## 2019-06-22 DIAGNOSIS — C8589 Other specified types of non-Hodgkin lymphoma, extranodal and solid organ sites: Secondary | ICD-10-CM | POA: Diagnosis not present

## 2019-06-22 DIAGNOSIS — G5602 Carpal tunnel syndrome, left upper limb: Secondary | ICD-10-CM | POA: Diagnosis not present

## 2019-06-22 DIAGNOSIS — I10 Essential (primary) hypertension: Secondary | ICD-10-CM

## 2019-06-22 LAB — COMPREHENSIVE METABOLIC PANEL
ALT: 34 U/L (ref 0–44)
AST: 22 U/L (ref 15–41)
Albumin: 4.3 g/dL (ref 3.5–5.0)
Alkaline Phosphatase: 80 U/L (ref 38–126)
Anion gap: 10 (ref 5–15)
BUN: 19 mg/dL (ref 8–23)
CO2: 26 mmol/L (ref 22–32)
Calcium: 8.9 mg/dL (ref 8.9–10.3)
Chloride: 101 mmol/L (ref 98–111)
Creatinine, Ser: 0.92 mg/dL (ref 0.61–1.24)
GFR calc Af Amer: >60 mL/min (ref >60)
GFR calc non Af Amer: >60 mL/min (ref >60)
Glucose, Bld: 130 mg/dL — ABNORMAL HIGH (ref 70–99)
Potassium: 4.6 mmol/L (ref 3.5–5.1)
Sodium: 137 mmol/L (ref 135–145)
Total Bilirubin: 0.9 mg/dL (ref 0.3–1.2)
Total Protein: 7.9 g/dL (ref 6.5–8.1)

## 2019-06-22 LAB — CBC WITH DIFFERENTIAL/PLATELET
Abs Immature Granulocytes: 0.08 10*3/uL — ABNORMAL HIGH (ref 0.00–0.07)
Basophils Absolute: 0 10*3/uL (ref 0.0–0.1)
Basophils Relative: 0 %
Eosinophils Absolute: 0.1 10*3/uL (ref 0.0–0.5)
Eosinophils Relative: 2 %
HCT: 44.4 % (ref 39.0–52.0)
Hemoglobin: 15.7 g/dL (ref 13.0–17.0)
Immature Granulocytes: 1 %
Lymphocytes Relative: 21 %
Lymphs Abs: 1.4 10*3/uL (ref 0.7–4.0)
MCH: 28.5 pg (ref 26.0–34.0)
MCHC: 35.4 g/dL (ref 30.0–36.0)
MCV: 80.6 fL (ref 80.0–100.0)
Monocytes Absolute: 0.5 10*3/uL (ref 0.1–1.0)
Monocytes Relative: 8 %
Neutro Abs: 4.7 10*3/uL (ref 1.7–7.7)
Neutrophils Relative %: 68 %
Platelets: 220 10*3/uL (ref 150–400)
RBC: 5.51 MIL/uL (ref 4.22–5.81)
RDW: 13 % (ref 11.5–15.5)
WBC: 6.8 10*3/uL (ref 4.0–10.5)
nRBC: 0 % (ref 0.0–0.2)

## 2019-06-22 LAB — LACTATE DEHYDROGENASE: LDH: 175 U/L (ref 98–192)

## 2019-06-22 MED ORDER — AMLODIPINE BESYLATE 10 MG PO TABS: ORAL_TABLET | ORAL | 1 refills | Status: DC

## 2019-06-22 MED ORDER — MELOXICAM 15 MG PO TABS: ORAL_TABLET | ORAL | 1 refills | Status: DC

## 2019-06-22 NOTE — Progress Notes (Signed)
Date:  06/22/2019   Name:  Nathan Gill   DOB:  Aug 29, 1955   MRN:  [id number]   Chief Complaint: Hypertension, Hand Pain (takes Meloxicam for this), and Hyperlipidemia (not taking med for it)  Hypertension This is a chronic problem. The problem has been gradually improving since onset. The problem is controlled. Pertinent negatives include no anxiety, blurred vision, chest pain, headaches, malaise/fatigue, neck pain, orthopnea, palpitations, peripheral edema, PND, shortness of breath or sweats. There are no associated agents to hypertension. Risk factors for coronary artery disease include dyslipidemia. Past treatments include calcium channel blockers. The current treatment provides moderate improvement. There are no compliance problems.  There is no history of angina, kidney disease, CAD/MI, CVA, heart failure, left ventricular hypertrophy, PVD or retinopathy. There is no history of chronic renal disease, a hypertension causing med or renovascular disease.  Hyperlipidemia This is a chronic problem. The current episode started more than 1 year ago. The problem is controlled. Recent lipid tests were reviewed and are normal. He has no history of chronic renal disease, diabetes, hypothyroidism, liver disease, obesity or nephrotic syndrome. Factors aggravating his hyperlipidemia include thiazides. Pertinent negatives include no chest pain, focal sensory loss, focal weakness, leg pain, myalgias or shortness of breath. Current antihyperlipidemic treatment includes diet change. The current treatment provides moderate improvement of lipids. There are no compliance problems.  Risk factors for coronary artery disease include dyslipidemia and hypertension.  Wrist Pain  The pain is present in the right wrist. This is a chronic problem. The current episode started more than 1 year ago. There has been a history of trauma (none recent). The problem occurs daily. The problem has been gradually worsening. The pain is  at a severity of 6/10. The pain is moderate. Associated symptoms include a limited range of motion, numbness and tingling. Pertinent negatives include no fever, inability to bear weight, itching, joint locking, joint swelling or stiffness. The symptoms are aggravated by activity. He has tried NSAIDS for the symptoms. The treatment provided moderate relief. There is no history of diabetes.    Lab Results  Component Value Date   CREATININE 0.86 11/09/2018   BUN 17 11/09/2018   NA 142 11/09/2018   K 4.4 11/09/2018   CL 104 11/09/2018   CO2 24 11/09/2018   Lab Results  Component Value Date   CHOL 233 (H) 11/09/2018   HDL 44 11/09/2018   LDLCALC 161 (H) 11/09/2018   TRIG 138 11/09/2018   No results found for: TSH No results found for: HGBA1C   Review of Systems  Constitutional: Negative for chills, fever and malaise/fatigue.  HENT: Negative for drooling, ear discharge, ear pain and sore throat.   Eyes: Negative for blurred vision.  Respiratory: Negative for cough, shortness of breath and wheezing.   Cardiovascular: Negative for chest pain, palpitations, orthopnea, leg swelling and PND.  Gastrointestinal: Negative for abdominal pain, blood in stool, constipation, diarrhea and nausea.  Endocrine: Negative for polydipsia.  Genitourinary: Negative for dysuria, frequency, hematuria and urgency.  Musculoskeletal: Negative for back pain, myalgias, neck pain and stiffness.  Skin: Negative for itching and rash.  Allergic/Immunologic: Negative for environmental allergies.  Neurological: Positive for tingling and numbness. Negative for dizziness, focal weakness and headaches.  Hematological: Does not bruise/bleed easily.  Psychiatric/Behavioral: Negative for suicidal ideas. The patient is not nervous/anxious.     Patient Active Problem List   Diagnosis Date Noted  . Essential hypertension 09/23/2016  . Bilateral carpal tunnel syndrome 09/23/2016  .  Cervical disc disease 09/23/2016  .  Epicondylitis, lateral, left 09/23/2016  . Gastroesophageal reflux disease without esophagitis 09/23/2016  . Large cell lymphoma, extranodal and solid organ sites [facility]) 01/28/2016    No Known Allergies  No past surgical history on file.  Social History   Tobacco Use  . Smoking status: Never Smoker  . Smokeless tobacco: Never Used  Substance Use Topics  . Alcohol use: Yes    Comment: average 2 cans a month at most  . Drug use: No     Medication list has been reviewed and updated.  Current Meds  Medication Sig  . acetaminophen (TYLENOL) 325 MG tablet Take 325 mg by mouth every 6 (six) hours as needed.  Marland Kitchen amLODipine (NORVASC) 10 MG tablet TAKE (1) TABLET BY MOUTH EVERY DAY  . meloxicam (MOBIC) 15 MG tablet TAKE ONE (1) TABLET BY MOUTH ONCE DAILY  . omeprazole (PRILOSEC) 20 MG capsule Take 1 capsule (20 mg total) by mouth daily. otc    PHQ 2/9 Scores 06/22/2019 11/09/2018 02/15/2018 10/05/2017  PHQ - 2 Score 0 0 0 0  PHQ- 9 Score 0 0 - 0    BP Readings from Last 3 Encounters:  06/22/19 136/80  12/09/18 120/72  11/09/18 130/80    Physical Exam Vitals and nursing note reviewed.  HENT:     Head: Normocephalic.     Right Ear: Tympanic membrane, ear canal and external ear normal.     Left Ear: Tympanic membrane, ear canal and external ear normal.     Nose: Nose normal. No congestion or rhinorrhea.     Mouth/Throat:     Mouth: Mucous membranes are moist.     Pharynx: No oropharyngeal exudate or posterior oropharyngeal erythema.  Eyes:     General: No scleral icterus.       Right eye: No discharge.        Left eye: No discharge.     Conjunctiva/sclera: Conjunctivae normal.     Pupils: Pupils are equal, round, and reactive to light.  Neck:     Thyroid: No thyromegaly.     Vascular: No JVD.     Trachea: No tracheal deviation.  Cardiovascular:     Rate and Rhythm: Normal rate and regular rhythm.     Heart sounds: Normal heart sounds. No murmur. No friction rub. No  gallop.   Pulmonary:     Effort: No respiratory distress.     Breath sounds: Normal breath sounds. No wheezing, rhonchi or rales.  Abdominal:     General: Bowel sounds are normal.     Palpations: Abdomen is soft. There is no mass.     Tenderness: There is no abdominal tenderness. There is no guarding or rebound.  Musculoskeletal:        General: No tenderness.     Cervical back: Normal range of motion and neck supple.  Lymphadenopathy:     Cervical: No cervical adenopathy.  Skin:    General: Skin is warm.     Capillary Refill: Capillary refill takes less than 2 seconds.     Findings: No rash.  Neurological:     Mental Status: He is alert and oriented to person, place, and time.     Cranial Nerves: No cranial nerve deficit.     Deep Tendon Reflexes: Reflexes are normal and symmetric.     Wt Readings from Last 3 Encounters:  06/22/19 249 lb (112.9 kg)  12/09/18 235 lb (106.6 kg)  11/09/18 234 lb (106.1 kg)  BP 136/80   Pulse 80   Ht 5\' 11"  (1.803 m)   Wt 249 lb (112.9 kg)   BMI 34.73 kg/m   Assessment and Plan:  1. Non-Hodgkin's lymphoma of testis [facility]) Patient is currently to be followed by Dr. Mike Gip for his non-Hodgkin's lymphoma testes.  We called downstairs to cancer and to reestablish annual visits given the Covid circumstance.  Patient is going to oncology for lab work today and I will pick up lab work for his essential hypertension from there. - amLODipine (NORVASC) 10 MG tablet; TAKE (1) TABLET BY MOUTH EVERY DAY  Dispense: 90 tablet; Refill: 1  2. Essential hypertension Chronic.  Controlled.  Stable.  Will continue amlodipine 10 mg once a day.  Will obtain likely a CMP when patient goes downstairs for oncology lab work. - amLODipine (NORVASC) 10 MG tablet; TAKE (1) TABLET BY MOUTH EVERY DAY  Dispense: 90 tablet; Refill: 1  3. Carpal tunnel syndrome of left wrist Chronic.  Uncontrolled.  Worsening.  Patient has had gradual increase of pain and decreased of  range of motion of his right wrist which is important for his work.  Patient tried to stop meloxicam without success and has resumed it with some relief but not entirely.  Will refer to orthopedics for evaluation possible injection possibility of any other involvement. - meloxicam (MOBIC) 15 MG tablet; TAKE ONE (1) TABLET BY MOUTH ONCE DAILY  Dispense: 90 tablet; Refill: 1 - Ambulatory referral to Orthopedic Surgery

## 2019-06-26 NOTE — Progress Notes (Signed)
[facility]  [address], [address] [place], [place] [address] Phone: [phone]  Fax: [phone]   Clinic Day:  06/30/2019  Referring physician: Juline Patch, MD  Chief Complaint: Nathan Gill is a 64 y.o. male with stage IE diffuse large cell lymphoma who is seen for new patient assessment and management.   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 He presented with an enlarging left testis over the course of 3-4 months.  He underwent radical left orchiectomy on 07/08/2011 by Dr Ernst Spell.  Pathology revealed diffuse large cell lymphoma.  There was a monoclonal population of large B cells expressing kappa light chains, CD19, CD20, CD10, and CD5.  Bone marrow was negative.  MUGA on 07/24/2011 revealed an EF of 57.2%.  PET scan on 07/30/2011 revealed no hypermetabolic lymph nodes identified in the neck, chest, abdomen, or pelvis. There was mild metabolic activity in the left groin and origin of the inguinal  canal likely related to recent prior orchiectomy.  LP on 08/10/2011 revealed no malignant cells.    He was originally seen by Dr Leia Alf then Dr Charlaine Dalton.  He received RCHOP x 6 (08/13/2011 - 12/29/2011).  He received involved field radiation to 3000 cGy over 3 weeks to his scrotum.  He received CNS prophylaxis.  He received 1 cycle of high dose IV MTX complicated by acute renal failure.  He was hospitalized for 9 days.  He received intrathecal MTX x 4 during cycles #3-6  (10/20/2011-12/28/2011).   PET scan on 06/23/2012 was normal.  Chest, abdomen and pelvis CT on 01/29/2015 was stable with no evidence for residual or recurrent adenopathy.  Abdomen and pelvis CT on 01/27/2016 revealed no evidence of recurrent lymphoma.  He was last seen by Dr. Melburn Hake on 02/01/2018. At that time, he was doing well.  He denied any B symptoms. Exam revealed no adenopathy.  CBC revealed a hematocrit 44.6, hemoglobin  15.3, MCV 84.2, platelets 194,000, WBC 5000, ANC 3300. LDH was 155.  Colonoscopy recommended.  CBC followed 11/09/2018: Hematocrit 43.6, hemoglobin 15.5, MCV 81.0, platelets 218,000, WBC 5700, ANC 3900. LDH 216. 06/22/2019: Hematocrit 44.4, hemoglobin 15.7, MCV 80.6, platelets 220,000, WBC 6800, ANC 4700. LDH 175  Symptomatically, he is doing well.  He denies any fevers, sweats or weight loss.  He has gained 12 pounds since last year.  He denies any adenopathy or early satiety.  He denies any testicular swelling.  He has some arthritis.  At times, he notes some discomfort in his right forearm at night.  He has not had his colonoscopy secondary to COVID-19; he has never had a colonoscopy.  There are several family members on his father's side with cancer:  father (lung cancer), paternal grandfather (melanoma), and paternal cousin (colon cancer).   Past Medical History:  Diagnosis Date  . Cancer ([place])   . Hypertension   . Lymphoma ([place]) 2013   chemo/radiation treatment    History reviewed. No pertinent surgical history.  Family History  Problem Relation Age of Onset  . Lung cancer Father   . Leukemia Maternal Uncle   . Melanoma Paternal Grandfather   . Colon cancer Cousin     Social History:  reports that he has never smoked. He has never used smokeless tobacco. He reports current alcohol use. He reports that he does not use drugs.  He may drink a beer every 2 weeks with pizza.  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  He lives in [place].  The patient is alone today.  Allergies: No Known Allergies  Current Medications: Current Outpatient Medications  Medication Sig Dispense Refill  . acetaminophen (TYLENOL) 325 MG tablet Take 325 mg by mouth every 6 (six) hours as needed.    Marland Kitchen amLODipine (NORVASC) 10 MG tablet TAKE (1) TABLET BY MOUTH EVERY DAY 90 tablet 1  . meloxicam (MOBIC) 15 MG tablet TAKE ONE (1) TABLET  BY MOUTH ONCE DAILY 90 tablet 1  . omeprazole (PRILOSEC) 20 MG capsule Take 1 capsule (20 mg total) by mouth daily. otc 90 capsule 3   No current facility-administered medications for this visit.    Review of Systems  Constitutional: Negative.  Negative for chills, diaphoresis, fever, malaise/fatigue and weight loss (up 12 pounds).       Feels "alright".  HENT: Positive for hearing loss. Negative for congestion, ear pain, nosebleeds, sinus pain and sore throat.   Eyes: Negative.  Negative for blurred vision, double vision and photophobia.  Respiratory: Negative.  Negative for cough, sputum production and shortness of breath.   Cardiovascular: Negative.  Negative for chest pain, palpitations, orthopnea and leg swelling.  Gastrointestinal: Negative.  Negative for abdominal pain, blood in stool, constipation, diarrhea, heartburn, melena, nausea and vomiting.  Genitourinary: Negative.  Negative for dysuria, frequency and urgency.  Musculoskeletal: Positive for joint pain (arthritis). Negative for back pain.       Intermittent right forearm discomfort.  Skin: Negative.  Negative for itching and rash.  Neurological: Negative.  Negative for dizziness, tingling, sensory change, speech change, focal weakness, weakness and headaches.  Endo/Heme/Allergies: Negative.  Does not bruise/bleed easily.  Psychiatric/Behavioral: Negative.  Negative for depression. The patient is not nervous/anxious and does not have insomnia.    Performance status (ECOG): 0-1  Vitals Blood pressure (!) 143/85, pulse 73, temperature 98 F (36.7 C), temperature source Oral, resp. rate 16, weight 251 lb 3.4 oz (114 kg), SpO2 99 %.   Physical Exam  Constitutional: He is oriented to person, place, and time. He appears well-developed and well-nourished. No distress.  HENT:  Head: Normocephalic and atraumatic.  Mouth/Throat: Oropharynx is clear and moist. No oropharyngeal exudate.  Short gray hair.  Hearing aide.  Mask.   Eyes: Pupils are equal, round, and reactive to light. Conjunctivae are normal.  Glasses propped on head.  Blue eyes.  Neck: No JVD present.  Cardiovascular: Normal rate and normal heart sounds. Exam reveals no gallop.  No murmur heard. Pulmonary/Chest: Effort normal and breath sounds normal. No respiratory distress. He has no wheezes. He has no rales.  Abdominal: Soft. Bowel sounds are normal. He exhibits no distension and no mass. There is no hepatosplenomegaly. There is no abdominal tenderness. There is no rebound and no guarding.  Musculoskeletal:        General: No edema. Normal range of motion.     Cervical back: Normal range of motion and neck supple.  Lymphadenopathy:       Head (right side): No preauricular, no posterior auricular and no occipital adenopathy present.       Head (left side): No preauricular, no posterior auricular and no occipital adenopathy present.    He has no cervical adenopathy.    He has no axillary adenopathy.       Right: No inguinal and no supraclavicular adenopathy present.       Left: No  inguinal and no supraclavicular adenopathy present.  Neurological: He is oriented to person, place, and time.  Skin: Skin is warm and dry. No rash noted. He is not diaphoretic. No erythema. No pallor.  Psychiatric: He has a normal mood and affect. His behavior is normal. Judgment and thought content normal.  Nursing note and vitals reviewed.   No visits with results within 3 Day(s) from this visit.  Latest known visit with results is:  Appointment on 06/22/2019  Component Date Value Ref Range Status  . Sodium 06/22/2019 137  135 - 145 mmol/L Final  . Potassium 06/22/2019 4.6  3.5 - 5.1 mmol/L Final  . Chloride 06/22/2019 101  98 - 111 mmol/L Final  . CO2 06/22/2019 26  22 - 32 mmol/L Final  . Glucose, Bld 06/22/2019 130* 70 - 99 mg/dL Final  . BUN 06/22/2019 19  8 - 23 mg/dL Final  . Creatinine, Ser 06/22/2019 0.92  0.61 - 1.24 mg/dL Final  . Calcium 06/22/2019  8.9  8.9 - 10.3 mg/dL Final  . Total Protein 06/22/2019 7.9  6.5 - 8.1 g/dL Final  . Albumin 06/22/2019 4.3  3.5 - 5.0 g/dL Final  . AST 06/22/2019 22  15 - 41 U/L Final  . ALT 06/22/2019 34  0 - 44 U/L Final  . Alkaline Phosphatase 06/22/2019 80  38 - 126 U/L Final  . Total Bilirubin 06/22/2019 0.9  0.3 - 1.2 mg/dL Final  . GFR calc non Af Amer 06/22/2019 >60  >60 mL/min Final  . GFR calc Af Amer 06/22/2019 >60  >60 mL/min Final  . Anion gap 06/22/2019 10  5 - 15 Final   Performed at [facility], [address], [place], [place] [address]  . WBC 06/22/2019 6.8  4.0 - 10.5 K/uL Final  . RBC 06/22/2019 5.51  4.22 - 5.81 MIL/uL Final  . Hemoglobin 06/22/2019 15.7  13.0 - 17.0 g/dL Final  . HCT 06/22/2019 44.4  39.0 - 52.0 % Final  . MCV 06/22/2019 80.6  80.0 - 100.0 fL Final  . MCH 06/22/2019 28.5  26.0 - 34.0 pg Final  . MCHC 06/22/2019 35.4  30.0 - 36.0 g/dL Final  . RDW 06/22/2019 13.0  11.5 - 15.5 % Final  . Platelets 06/22/2019 220  150 - 400 K/uL Final  . nRBC 06/22/2019 0.0  0.0 - 0.2 % Final  . Neutrophils Relative % 06/22/2019 68  % Final  . Neutro Abs 06/22/2019 4.7  1.7 - 7.7 K/uL Final  . Lymphocytes Relative 06/22/2019 21  % Final  . Lymphs Abs 06/22/2019 1.4  0.7 - 4.0 K/uL Final  . Monocytes Relative 06/22/2019 8  % Final  . Monocytes Absolute 06/22/2019 0.5  0.1 - 1.0 K/uL Final  . Eosinophils Relative 06/22/2019 2  % Final  . Eosinophils Absolute 06/22/2019 0.1  0.0 - 0.5 K/uL Final  . Basophils Relative 06/22/2019 0  % Final  . Basophils Absolute 06/22/2019 0.0  0.0 - 0.1 K/uL Final  . Immature Granulocytes 06/22/2019 1  % Final  . Abs Immature Granulocytes 06/22/2019 0.08* 0.00 - 0.07 K/uL Final   Performed at [facility], [address], [place], [place] [address]  . LDH 06/22/2019 175  98 - 192 U/L Final   Performed at [facility], [address] [place], [place] [address]    Assessment:  Nathan Gill is a 64 y.o.  male with stage IE diffuse large cell lymphoma of the testicle  s/p radical left orchiectomy on 07/08/2011.  Pathology revealed diffuse large cell lymphoma.  There was a monoclonal population of large B cells expressing kappa light chains, CD19, CD20, CD10, and CD5.  Bone marrow was negative.  MUGA on 07/24/2011 revealed an EF of 57.2%.  PET scan on 07/30/2011 revealed no hypermetabolic lymph nodes identified in the neck, chest, abdomen, or pelvis. There was mild metabolic activity in the left groin and origin of the inguinal  canal likely related to recent prior orchiectomy.  LP on 08/10/2011 revealed no malignant cells.    He received RCHOP x 6 (08/13/2011 - 12/29/2011).  He received involved field radiation of 3000 cGy over 3 weeks to his scrotum.  He received CNS prophylaxis.  He received 1 cycle of high dose IV MTX complicated by acute renal failure.  He received intrathecal MTX x 4 during cycles #3-6  (10/20/2011-12/28/2011).   PET scan on 06/23/2012 was normal.  Chest, abdomen and pelvis CT on 01/29/2015 was stable with no evidence for residual or recurrent adenopathy.  Abdomen and pelvis CT on 01/27/2016 revealed no evidence of recurrent lymphoma.  He has never had a colonoscopy.  Symptomatically, he is doing well.  He denies any B symptoms.  Exam reveals no adenopathy or hepatosplenomegaly.  Plan: 1.   Review labs from 06/22/2019. 2.   Stage IE diffuse large cell lymphoma left testicle.  Review entire medical history, diagnosis and management of testicular lymphoma.  He received 6 cycles of RCHOP followed by radiation to the scrotum.  He received CNS prophylaxis with high dose MTX x 1 then IT MTX x 4.  Post treatment imaging has documented a complete remission.   Discuss no plan for imaging unless concerning symptoms, abnormalities in physical exam or labs.   Encourage follow-up during the year if any worrisome symptoms. 3.   Health maintenance  Consult Dr Allen Norris (GI):  colonoscopy.  Discuss COVID-19 precautions and immunization. 4.   RTC in 1 year for MD assessment and labs (CBC with diff, CMP, LDH, uric acid).  I discussed the assessment and treatment plan with the patient.  The patient was provided an opportunity to ask questions and all were answered.  The patient agreed with the plan and demonstrated an understanding of the instructions.  The patient was advised to call back if the symptoms worsen or if the condition fails to improve as anticipated.   Tarri Guilfoil C. Mike Gip, MD, PhD    06/30/2019, 12:00 PM

## 2019-06-29 NOTE — Progress Notes (Signed)
Confirmed Name and DOB. Denies any concerns.  

## 2019-06-30 ENCOUNTER — Inpatient Hospital Stay: Payer: BLUE CROSS/BLUE SHIELD | Attending: Hematology and Oncology | Admitting: Hematology and Oncology

## 2019-06-30 ENCOUNTER — Other Ambulatory Visit: Payer: Self-pay

## 2019-06-30 ENCOUNTER — Encounter: Payer: Self-pay | Admitting: Hematology and Oncology

## 2019-06-30 VITALS — BP 143/85 | HR 73 | Temp 98.0°F | Resp 16 | Wt 251.2 lb

## 2019-06-30 DIAGNOSIS — Z807 Family history of other malignant neoplasms of lymphoid, hematopoietic and related tissues: Secondary | ICD-10-CM | POA: Diagnosis not present

## 2019-06-30 DIAGNOSIS — Z923 Personal history of irradiation: Secondary | ICD-10-CM | POA: Diagnosis not present

## 2019-06-30 DIAGNOSIS — Z9079 Acquired absence of other genital organ(s): Secondary | ICD-10-CM | POA: Diagnosis not present

## 2019-06-30 DIAGNOSIS — C8589 Other specified types of non-Hodgkin lymphoma, extranodal and solid organ sites: Secondary | ICD-10-CM | POA: Diagnosis not present

## 2019-06-30 DIAGNOSIS — Z0000 Encounter for general adult medical examination without abnormal findings: Secondary | ICD-10-CM

## 2019-06-30 DIAGNOSIS — Z8572 Personal history of non-Hodgkin lymphomas: Secondary | ICD-10-CM | POA: Diagnosis present

## 2019-06-30 DIAGNOSIS — Z7289 Other problems related to lifestyle: Secondary | ICD-10-CM | POA: Diagnosis not present

## 2019-06-30 DIAGNOSIS — Z9221 Personal history of antineoplastic chemotherapy: Secondary | ICD-10-CM | POA: Insufficient documentation

## 2019-06-30 DIAGNOSIS — Z801 Family history of malignant neoplasm of trachea, bronchus and lung: Secondary | ICD-10-CM | POA: Insufficient documentation

## 2019-07-03 ENCOUNTER — Telehealth: Payer: Self-pay

## 2019-07-03 NOTE — Telephone Encounter (Signed)
Message received from [place] states patient would like in network provider. Contacted patient and informed him he would need to reach out to his insurance company to find out who is in-network and then contact us with the provider he would like to see. Patient verbalizes understanding and denies any further questions or concerns.

## 2019-11-23 ENCOUNTER — Other Ambulatory Visit: Payer: Self-pay | Admitting: Family Medicine

## 2019-11-23 DIAGNOSIS — G5602 Carpal tunnel syndrome, left upper limb: Secondary | ICD-10-CM

## 2020-03-09 ENCOUNTER — Other Ambulatory Visit: Payer: Self-pay | Admitting: Family Medicine

## 2020-03-09 DIAGNOSIS — I10 Essential (primary) hypertension: Secondary | ICD-10-CM

## 2020-03-09 DIAGNOSIS — C8589 Other specified types of non-Hodgkin lymphoma, extranodal and solid organ sites: Secondary | ICD-10-CM

## 2020-03-09 NOTE — Telephone Encounter (Signed)
Requested Prescriptions  Pending Prescriptions Disp Refills   amLODipine (NORVASC) 10 MG tablet [Pharmacy Med Name: AMLODIPINE BESYLATE 10 MG TAB] 30 tablet 0    Sig: TAKE (1) TABLET BY MOUTH EVERY DAY     Cardiovascular:  Calcium Channel Blockers Failed - 03/09/2020 10:26 AM      Failed - Last BP in normal range    BP Readings from Last 1 Encounters:  06/30/19 (!) 143/85         Failed - Valid encounter within last 6 months    Recent Outpatient Visits          8 months ago Essential hypertension   [place], [place]   1 year ago Biceps tendon rupture, proximal, right, initial encounter   [facility] Juline Patch, MD   1 year ago Essential hypertension   [facility] Juline Patch, MD   2 years ago Ciliary flush   [facility] Juline Patch, MD   2 years ago Essential hypertension   [facility] Juline Patch, MD

## 2020-05-06 ENCOUNTER — Other Ambulatory Visit: Payer: Self-pay | Admitting: Family Medicine

## 2020-05-06 DIAGNOSIS — C8589 Other specified types of non-Hodgkin lymphoma, extranodal and solid organ sites: Secondary | ICD-10-CM

## 2020-05-06 DIAGNOSIS — I10 Essential (primary) hypertension: Secondary | ICD-10-CM

## 2020-05-09 ENCOUNTER — Ambulatory Visit (INDEPENDENT_AMBULATORY_CARE_PROVIDER_SITE_OTHER): Payer: BLUE CROSS/BLUE SHIELD | Admitting: Family Medicine

## 2020-05-09 ENCOUNTER — Encounter: Payer: Self-pay | Admitting: Family Medicine

## 2020-05-09 ENCOUNTER — Other Ambulatory Visit: Payer: Self-pay

## 2020-05-09 VITALS — BP 130/80 | HR 80 | Ht 71.0 in | Wt 240.0 lb

## 2020-05-09 DIAGNOSIS — G5602 Carpal tunnel syndrome, left upper limb: Secondary | ICD-10-CM

## 2020-05-09 DIAGNOSIS — I10 Essential (primary) hypertension: Secondary | ICD-10-CM | POA: Diagnosis not present

## 2020-05-09 DIAGNOSIS — M5412 Radiculopathy, cervical region: Secondary | ICD-10-CM | POA: Diagnosis not present

## 2020-05-09 MED ORDER — AMLODIPINE BESYLATE 10 MG PO TABS: ORAL_TABLET | ORAL | 1 refills | Status: DC

## 2020-05-09 MED ORDER — MELOXICAM 15 MG PO TABS: ORAL_TABLET | ORAL | 1 refills | Status: DC

## 2020-05-09 NOTE — Progress Notes (Signed)
Date:  05/09/2020   Name:  Nathan Gill   DOB:  1956/06/19   MRN:  [id number]   Chief Complaint: Hypertension and Arthritis (joint pain- takes meloxicam )  Hypertension This is a chronic problem. The current episode started more than 1 year ago. The problem has been gradually improving since onset. The problem is controlled. Pertinent negatives include no anxiety, blurred vision, chest pain, headaches, malaise/fatigue, neck pain, orthopnea, palpitations, peripheral edema, PND, shortness of breath or sweats. There are no associated agents to hypertension. Past treatments include calcium channel blockers. The current treatment provides moderate improvement. There are no compliance problems.  There is no history of angina, kidney disease, CAD/MI, CVA, heart failure, left ventricular hypertrophy, PVD or retinopathy. There is no history of chronic renal disease, a hypertension causing med or renovascular disease.  Arthritis Presents for follow-up visit. He reports no pain, stiffness, joint swelling or joint warmth. The symptoms have been stable. Affected locations include the right wrist and left elbow (sciatic Wynelle Link). Pertinent negatives include no diarrhea, dry eyes, dry mouth, dysuria, fatigue, fever, pain at night, pain while resting, rash, Raynaud's syndrome or uveitis.    Lab Results  Component Value Date   CREATININE 0.92 06/22/2019   BUN 19 06/22/2019   NA 137 06/22/2019   K 4.6 06/22/2019   CL 101 06/22/2019   CO2 26 06/22/2019   Lab Results  Component Value Date   CHOL 233 (H) 11/09/2018   HDL 44 11/09/2018   LDLCALC 161 (H) 11/09/2018   TRIG 138 11/09/2018   No results found for: TSH No results found for: HGBA1C Lab Results  Component Value Date   WBC 6.8 06/22/2019   HGB 15.7 06/22/2019   HCT 44.4 06/22/2019   MCV 80.6 06/22/2019   PLT 220 06/22/2019   Lab Results  Component Value Date   ALT 34 06/22/2019   AST 22 06/22/2019   ALKPHOS 80 06/22/2019   BILITOT 0.9  06/22/2019     Review of Systems  Constitutional: Negative for chills, fatigue, fever and malaise/fatigue.  HENT: Negative for congestion, dental problem, drooling, ear discharge, ear pain and sore throat.   Eyes: Negative for blurred vision.  Respiratory: Negative for cough, shortness of breath and wheezing.   Cardiovascular: Negative for chest pain, palpitations, orthopnea, leg swelling and PND.  Gastrointestinal: Negative for abdominal pain, blood in stool, constipation, diarrhea and nausea.  Endocrine: Negative for polydipsia.  Genitourinary: Negative for dysuria, frequency, hematuria and urgency.  Musculoskeletal: Positive for arthritis. Negative for back pain, joint swelling, myalgias, neck pain and stiffness.  Skin: Negative for rash.  Allergic/Immunologic: Negative for environmental allergies.  Neurological: Negative for dizziness and headaches.  Hematological: Does not bruise/bleed easily.  Psychiatric/Behavioral: Negative for suicidal ideas. The patient is not nervous/anxious.     Patient Active Problem List   Diagnosis Date Noted  . Healthcare maintenance 06/30/2019  . Essential hypertension 09/23/2016  . Bilateral carpal tunnel syndrome 09/23/2016  . Cervical disc disease 09/23/2016  . Epicondylitis, lateral, left 09/23/2016  . Gastroesophageal reflux disease without esophagitis 09/23/2016  . Large cell lymphoma, extranodal and solid organ sites [facility]) 01/28/2016    No Known Allergies  No past surgical history on file.  Social History   Tobacco Use  . Smoking status: Never Smoker  . Smokeless tobacco: Never Used  Substance Use Topics  . Alcohol use: Yes    Comment: average 2 cans a month at most  . Drug use: No  Medication list has been reviewed and updated.  Current Meds  Medication Sig  . acetaminophen (TYLENOL) 325 MG tablet Take 325 mg by mouth every 6 (six) hours as needed.  Marland Kitchen amLODipine (NORVASC) 10 MG tablet TAKE (1) TABLET BY MOUTH EVERY DAY    . meloxicam (MOBIC) 15 MG tablet TAKE (1) TABLET BY MOUTH EVERY DAY  . omeprazole (PRILOSEC) 20 MG capsule Take 1 capsule (20 mg total) by mouth daily. otc    PHQ 2/9 Scores 05/09/2020 06/22/2019 11/09/2018 02/15/2018  PHQ - 2 Score 0 0 0 0  PHQ- 9 Score 0 0 0 -    GAD 7 : Generalized Anxiety Score 05/09/2020 06/22/2019  Nervous, Anxious, on Edge 0 0  Control/stop worrying 0 0  Worry too much - different things 0 0  Trouble relaxing 0 0  Restless 0 0  Easily annoyed or irritable 0 0  Afraid - awful might happen 0 0  Total GAD 7 Score 0 0    BP Readings from Last 3 Encounters:  05/09/20 130/80  06/30/19 (!) 143/85  06/22/19 136/80    Physical Exam Vitals and nursing note reviewed.  HENT:     Head: Normocephalic.     Right Ear: Tympanic membrane, ear canal and external ear normal. There is no impacted cerumen.     Left Ear: Tympanic membrane, ear canal and external ear normal. There is no impacted cerumen.     Nose: Nose normal. No congestion or rhinorrhea.     Mouth/Throat:     Mouth: Mucous membranes are moist.  Eyes:     General: No scleral icterus.       Right eye: No discharge.        Left eye: No discharge.     Conjunctiva/sclera: Conjunctivae normal.     Pupils: Pupils are equal, round, and reactive to light.  Neck:     Thyroid: No thyromegaly.     Vascular: No JVD.     Trachea: No tracheal deviation.  Cardiovascular:     Rate and Rhythm: Normal rate and regular rhythm.     Heart sounds: Normal heart sounds. No murmur heard.  No friction rub. No gallop.   Pulmonary:     Effort: Pulmonary effort is normal. No respiratory distress.     Breath sounds: Normal breath sounds. No stridor. No wheezing, rhonchi or rales.  Chest:     Chest wall: No tenderness.  Abdominal:     General: Bowel sounds are normal.     Palpations: Abdomen is soft. There is no mass.     Tenderness: There is no abdominal tenderness. There is no right CVA tenderness, left CVA tenderness,  guarding or rebound.     Hernia: No hernia is present.  Musculoskeletal:        General: No tenderness. Normal range of motion.     Cervical back: Normal range of motion and neck supple.  Lymphadenopathy:     Cervical: No cervical adenopathy.  Skin:    General: Skin is warm.     Capillary Refill: Capillary refill takes less than 2 seconds.     Findings: No rash.  Neurological:     Mental Status: He is alert and oriented to person, place, and time.     Cranial Nerves: No cranial nerve deficit.     Sensory: No sensory deficit.     Motor: No weakness.     Coordination: Coordination normal.     Deep Tendon Reflexes: Reflexes are normal and  symmetric.  Psychiatric:        Mood and Affect: Mood normal.     Wt Readings from Last 3 Encounters:  05/09/20 240 lb (108.9 kg)  06/30/19 251 lb 3.4 oz (114 kg)  06/22/19 249 lb (112.9 kg)    BP 130/80   Pulse 80   Ht 5\' 11"  (1.803 m)   Wt 240 lb (108.9 kg)   BMI 33.47 kg/m   Assessment and Plan: Patient's chart was reviewed from previous encounters.. 1. Essential hypertension Chronic.  Controlled.  Stable.  Blood pressure today is 130/80.  We will continue amlodipine 10 mg once a day.  Will check renal function panel for electrolyte and GFR status. - amLODipine (NORVASC) 10 MG tablet; One a day  Dispense: 90 tablet; Refill: 1 - Renal Function Panel  2. Carpal tunnel syndrome of left wrist Chronic.  Controlled.  Stable.  Primarily bothersome at night.  But there is some pain when doing work related activities.  Pain is more so in the left wrist versus the right.  Review of EMG notes that there is abnormality with severe in the left versus moderate in the right wrist.  Patient currently will treat with splints and will continue meloxicam 15 mg once a day. - meloxicam (MOBIC) 15 MG tablet; TAKE (1) TABLET BY MOUTH EVERY DAY  Dispense: 90 tablet; Refill: 1  3. Cervical radiculopathy Chronic.  Controlled.  Stable.  Followed by [facility]  clinic orthopedics.  We will continue meloxicam 15 mg once a day.

## 2020-05-10 LAB — RENAL FUNCTION PANEL
Albumin: 4.7 g/dL (ref 3.8–4.8)
BUN/Creatinine Ratio: 19 (ref 10–24)
BUN: 16 mg/dL (ref 8–27)
CO2: 21 mmol/L (ref 20–29)
Calcium: 9.2 mg/dL (ref 8.6–10.2)
Chloride: 100 mmol/L (ref 96–106)
Creatinine, Ser: 0.85 mg/dL (ref 0.76–1.27)
GFR calc Af Amer: 106 mL/min/{1.73_m2} (ref >59)
GFR calc non Af Amer: 92 mL/min/{1.73_m2} (ref >59)
Glucose: 94 mg/dL (ref 65–99)
Phosphorus: 3.8 mg/dL (ref 2.8–4.1)
Potassium: 4.6 mmol/L (ref 3.5–5.2)
Sodium: 136 mmol/L (ref 134–144)

## 2020-06-24 NOTE — Progress Notes (Signed)
[facility]  [address] [place], [place] [address] Phone: [phone]  Fax: [phone]   Clinic Day:  06/25/2020  Referring physician: Juline Patch, MD  Chief Complaint: Nathan Gill is a 65 y.o. male with stage IE diffuse large cell lymphoma who is seen for 1 year assessment.  HPI:   The patient was last last seen in the hematology clinic on 06/30/2019 for new patient assessment. At that time, he was doing well.  He denied any B symptoms.  Exam revealed no adenopathy or hepatosplenomegaly. Hematocrit was 44.4, hemoglobin 15.7, platelets 220,000, WBC 6,800. CMP was normal. LDH was 175.  We discussed yearly surveillance.  During the interim, he has been "fine." He has not had any medical problems in the past year. He saw a neurologist for right forearm discomfort and was told he has carpal tunnel syndrome. He denies blood in the stool and black stool.  He denies any B symptoms.  He has never had a colonoscopy.   Past Medical History:  Diagnosis Date  . Cancer ([place])   . Hypertension   . Lymphoma ([place]) 2013   chemo/radiation treatment    History reviewed. No pertinent surgical history.  Family History  Problem Relation Age of Onset  . Lung cancer Father   . Leukemia Maternal Uncle   . Melanoma Paternal Grandfather   . Colon cancer Cousin     Social History:  reports that he has never smoked. He has never used smokeless tobacco. He reports current alcohol use. He reports that he does not use drugs.  He may drink a beer every 2 weeks with pizza.  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  He lives in [place].  The patient is alone today.  Allergies: No Known Allergies  Current Medications: Current Outpatient Medications  Medication Sig Dispense Refill  . acetaminophen (TYLENOL) 325 MG tablet Take 325 mg by mouth every 6 (six) hours as needed.    Marland Kitchen amLODipine  (NORVASC) 10 MG tablet One a day 90 tablet 1  . meloxicam (MOBIC) 15 MG tablet TAKE (1) TABLET BY MOUTH EVERY DAY 90 tablet 1  . omeprazole (PRILOSEC) 20 MG capsule Take 1 capsule (20 mg total) by mouth daily. otc 90 capsule 3   No current facility-administered medications for this visit.    Review of Systems  Constitutional: Positive for weight loss (7 lbs). Negative for chills, diaphoresis, fever and malaise/fatigue.       Feels "fine".  HENT: Positive for hearing loss. Negative for congestion, ear discharge, ear pain, nosebleeds, sinus pain, sore throat and tinnitus.   Eyes: Negative.  Negative for blurred vision, double vision and photophobia.  Respiratory: Negative.  Negative for cough, hemoptysis, sputum production and shortness of breath.   Cardiovascular: Negative.  Negative for chest pain, palpitations, orthopnea and leg swelling.  Gastrointestinal: Negative.  Negative for abdominal pain, blood in stool, constipation, diarrhea, heartburn, melena, nausea and vomiting.  Genitourinary: Negative.  Negative for dysuria, frequency, hematuria and urgency.  Musculoskeletal: Positive for joint pain (arthritis). Negative for back pain, myalgias and neck pain.  Skin: Negative.  Negative for itching and rash.  Neurological: Negative for dizziness, tingling, sensory change, speech change, focal weakness, weakness and headaches.       Carpal tunnel syndrome  Endo/Heme/Allergies: Negative.  Does not bruise/bleed easily.  Psychiatric/Behavioral: Negative.  Negative for depression and memory loss.  The patient is not nervous/anxious and does not have insomnia.   All other systems reviewed and are negative.  Performance status (ECOG): 0  Vitals Blood pressure 126/84, pulse 71, temperature (!) 96.2 F (35.7 C), temperature source Tympanic, resp. rate 16, weight 244 lb 4.3 oz (110.8 kg), SpO2 100 %.   Physical Exam Vitals and nursing note reviewed.  Constitutional:      General: He is not in  acute distress.    Appearance: He is well-developed. He is not diaphoretic.  HENT:     Head: Normocephalic and atraumatic.     Comments: Short gray hair.  Handlebar mustache.    Ears:     Comments: Hearing aid.    Mouth/Throat:     Mouth: Mucous membranes are moist.     Pharynx: Oropharynx is clear. No oropharyngeal exudate.  Eyes:     Extraocular Movements: Extraocular movements intact.     Conjunctiva/sclera: Conjunctivae normal.     Pupils: Pupils are equal, round, and reactive to light.     Comments: Blue eyes.  Neck:     Vascular: No JVD.  Cardiovascular:     Rate and Rhythm: Normal rate.     Heart sounds: Normal heart sounds. No murmur heard. No gallop.   Pulmonary:     Effort: Pulmonary effort is normal. No respiratory distress.     Breath sounds: Normal breath sounds. No wheezing or rales.  Chest:  Breasts:     Right: No axillary adenopathy or supraclavicular adenopathy.     Left: No axillary adenopathy or supraclavicular adenopathy.    Abdominal:     General: Bowel sounds are normal. There is no distension.     Palpations: Abdomen is soft. There is no mass.     Tenderness: There is no abdominal tenderness. There is no guarding or rebound.  Musculoskeletal:        General: Normal range of motion.     Cervical back: Normal range of motion and neck supple.  Lymphadenopathy:     Head:     Right side of head: No preauricular, posterior auricular or occipital adenopathy.     Left side of head: No preauricular, posterior auricular or occipital adenopathy.     Cervical: No cervical adenopathy.     Upper Body:     Right upper body: No supraclavicular or axillary adenopathy.     Left upper body: No supraclavicular or axillary adenopathy.     Lower Body: No right inguinal adenopathy. No left inguinal adenopathy.  Skin:    General: Skin is warm and dry.     Coloration: Skin is not pale.     Findings: No erythema or rash.  Neurological:     Mental Status: He is oriented  to person, place, and time.  Psychiatric:        Behavior: Behavior normal.        Thought Content: Thought content normal.        Judgment: Judgment normal.    Appointment on 06/25/2020  Component Date Value Ref Range Status  . LDH 06/25/2020 161  98 - 192 U/L Final   Performed at [facility], [address] [place], [place] [address]  . Uric Acid, Serum 06/25/2020 6.6  3.7 - 8.6 mg/dL Final   Performed at [facility], [address] [place], [place] [address]  . Sodium 06/25/2020 140  135 - 145 mmol/L Final  . Potassium 06/25/2020 4.4  3.5 - 5.1 mmol/L Final  .  Chloride 06/25/2020 103  98 - 111 mmol/L Final  . CO2 06/25/2020 27  22 - 32 mmol/L Final  . Glucose, Bld 06/25/2020 112* 70 - 99 mg/dL Final   Glucose reference range applies only to samples taken after fasting for at least 8 hours.  . BUN 06/25/2020 19  8 - 23 mg/dL Final  . Creatinine, Ser 06/25/2020 0.91  0.61 - 1.24 mg/dL Final  . Calcium 60/63/0160 9.3  8.9 - 10.3 mg/dL Final  . Total Protein 06/25/2020 7.7  6.5 - 8.1 g/dL Final  . Albumin 10/93/2355 4.5  3.5 - 5.0 g/dL Final  . AST 73/22/0254 26  15 - 41 U/L Final  . ALT 06/25/2020 35  0 - 44 U/L Final  . Alkaline Phosphatase 06/25/2020 81  38 - 126 U/L Final  . Total Bilirubin 06/25/2020 0.7  0.3 - 1.2 mg/dL Final  . GFR, Estimated 06/25/2020 >60  >60 mL/min Final   Comment: (NOTE) Calculated using the CKD-EPI Creatinine Equation (2021)   . Anion gap 06/25/2020 10  5 - 15 Final   Performed at [facility], [address], [place], [place] [address]  . WBC 06/25/2020 6.2  4.0 - 10.5 K/uL Final  . RBC 06/25/2020 5.45  4.22 - 5.81 MIL/uL Final  . Hemoglobin 06/25/2020 15.7  13.0 - 17.0 g/dL Final  . HCT 37/62/8315 44.4  39.0 - 52.0 % Final  . MCV 06/25/2020 81.5  80.0 - 100.0 fL Final  . MCH 06/25/2020 28.8  26.0 - 34.0 pg Final  . MCHC 06/25/2020 35.4  30.0 - 36.0 g/dL Final  . RDW 17/61/6073 13.2  11.5 - 15.5 % Final   . Platelets 06/25/2020 187  150 - 400 K/uL Final  . nRBC 06/25/2020 0.0  0.0 - 0.2 % Final  . Neutrophils Relative % 06/25/2020 69  % Final  . Neutro Abs 06/25/2020 4.3  1.7 - 7.7 K/uL Final  . Lymphocytes Relative 06/25/2020 20  % Final  . Lymphs Abs 06/25/2020 1.2  0.7 - 4.0 K/uL Final  . Monocytes Relative 06/25/2020 7  % Final  . Monocytes Absolute 06/25/2020 0.5  0.1 - 1.0 K/uL Final  . Eosinophils Relative 06/25/2020 2  % Final  . Eosinophils Absolute 06/25/2020 0.1  0.0 - 0.5 K/uL Final  . Basophils Relative 06/25/2020 1  % Final  . Basophils Absolute 06/25/2020 0.0  0.0 - 0.1 K/uL Final  . Immature Granulocytes 06/25/2020 1  % Final  . Abs Immature Granulocytes 06/25/2020 0.05  0.00 - 0.07 K/uL Final   Performed at [facility], [address] [place], [place] [address]    Assessment:  Nathan Gill is a 65 y.o. 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  Pathology revealed diffuse large cell lymphoma.  There was a monoclonal population of large B cells expressing kappa light chains, CD19, CD20, CD10, and CD5.  Bone marrow was negative.  MUGA on 07/24/2011 revealed an EF of 57.2%.  PET scan on 07/30/2011 revealed no hypermetabolic lymph nodes identified in the neck, chest, abdomen, or pelvis. There was mild metabolic activity in the left groin and origin of the inguinal canal likely related to recent prior orchiectomy.  LP on 08/10/2011 revealed no malignant cells.    He received RCHOP x 6 (08/13/2011 - 12/29/2011).  He received involved field radiation of 3000 cGy over 3 weeks to his scrotum.  He received CNS prophylaxis.  He received 1 cycle of high dose IV MTX complicated by acute renal failure.  He received intrathecal MTX x 4 during cycles #3-6  (10/20/2011-12/28/2011).   PET scan on 06/23/2012 was normal.  Chest, abdomen and pelvis CT on 01/29/2015 was stable with no evidence for residual or  recurrent adenopathy.  Abdomen and pelvis CT on 01/27/2016 revealed no evidence of recurrent lymphoma.  He has never had a colonoscopy.  He received the Moderna COVID-19 vaccine on 08/29/2019 and 09/26/2019. He received the [employer] on 04/21/2020.  Symptomatically, he is doing well.  He denies any B symptoms.  Exam reveals no evidence of adenopathy or hepatosplenomegaly.  Plan: 1.   Labs today: CBC with diff, CMP, LDH, uric acid. 2.   Stage IE diffuse large cell lymphoma left testicle.  He is s/p 6 cycles of RCHOP followed by radiation to the scrotum.  He received CNS prophylaxis with high dose MTX x 1 then IT MTX x 4.  Post treatment imaging has documented a complete remission.   No plan for imaging unless concerning symptoms, abnormalities in physical exam or labs.  Symptomatically, he is doing well.  Exam and labs unremarkable.  He is will be 10 years s/p diagnosis and treatment next year. 3.   Health maintenance  Patient does not wish to have a colonoscopy.  Consider Cologuard.  Patient to follow-up with Dr Ronnald Ramp. 4.   RTC in 1 year for MD assessment and labs (CBC with diff, CMP, LDH, uric acid).  I discussed the assessment and treatment plan with the patient.  The patient was provided an opportunity to ask questions and all were answered.  The patient agreed with the plan and demonstrated an understanding of the instructions.  The patient was advised to call back if the symptoms worsen or if the condition fails to improve as anticipated.   Orpha Dain C. Mike Gip, MD, PhD    06/25/2020, 8:58 AM  I, De Burrs, am acting as scribe for [employer]. Mike Gip, MD, PhD.  I, Tyyne Cliett C. Mike Gip, MD, have reviewed the above documentation for accuracy and completeness, and I agree with the above.

## 2020-06-25 ENCOUNTER — Inpatient Hospital Stay: Payer: BLUE CROSS/BLUE SHIELD | Attending: Hematology and Oncology | Admitting: Hematology and Oncology

## 2020-06-25 ENCOUNTER — Inpatient Hospital Stay: Payer: BLUE CROSS/BLUE SHIELD

## 2020-06-25 ENCOUNTER — Other Ambulatory Visit: Payer: Self-pay

## 2020-06-25 ENCOUNTER — Encounter: Payer: Self-pay | Admitting: Hematology and Oncology

## 2020-06-25 VITALS — BP 126/84 | HR 71 | Temp 96.2°F | Resp 16 | Wt 244.3 lb

## 2020-06-25 DIAGNOSIS — Z8572 Personal history of non-Hodgkin lymphomas: Secondary | ICD-10-CM | POA: Diagnosis not present

## 2020-06-25 DIAGNOSIS — Z806 Family history of leukemia: Secondary | ICD-10-CM | POA: Insufficient documentation

## 2020-06-25 DIAGNOSIS — C8589 Other specified types of non-Hodgkin lymphoma, extranodal and solid organ sites: Secondary | ICD-10-CM | POA: Diagnosis not present

## 2020-06-25 DIAGNOSIS — Z800 Family history of malignant neoplasm of digestive organs: Secondary | ICD-10-CM | POA: Insufficient documentation

## 2020-06-25 DIAGNOSIS — Z923 Personal history of irradiation: Secondary | ICD-10-CM | POA: Diagnosis not present

## 2020-06-25 DIAGNOSIS — Z0000 Encounter for general adult medical examination without abnormal findings: Secondary | ICD-10-CM | POA: Diagnosis not present

## 2020-06-25 DIAGNOSIS — Z9079 Acquired absence of other genital organ(s): Secondary | ICD-10-CM | POA: Insufficient documentation

## 2020-06-25 DIAGNOSIS — Z9221 Personal history of antineoplastic chemotherapy: Secondary | ICD-10-CM | POA: Diagnosis not present

## 2020-06-25 DIAGNOSIS — Z7289 Other problems related to lifestyle: Secondary | ICD-10-CM | POA: Diagnosis not present

## 2020-06-25 DIAGNOSIS — G5600 Carpal tunnel syndrome, unspecified upper limb: Secondary | ICD-10-CM | POA: Insufficient documentation

## 2020-06-25 DIAGNOSIS — Z85820 Personal history of malignant melanoma of skin: Secondary | ICD-10-CM | POA: Insufficient documentation

## 2020-06-25 LAB — CBC WITH DIFFERENTIAL/PLATELET
Abs Immature Granulocytes: 0.05 10*3/uL (ref 0.00–0.07)
Basophils Absolute: 0 10*3/uL (ref 0.0–0.1)
Basophils Relative: 1 %
Eosinophils Absolute: 0.1 10*3/uL (ref 0.0–0.5)
Eosinophils Relative: 2 %
HCT: 44.4 % (ref 39.0–52.0)
Hemoglobin: 15.7 g/dL (ref 13.0–17.0)
Immature Granulocytes: 1 %
Lymphocytes Relative: 20 %
Lymphs Abs: 1.2 10*3/uL (ref 0.7–4.0)
MCH: 28.8 pg (ref 26.0–34.0)
MCHC: 35.4 g/dL (ref 30.0–36.0)
MCV: 81.5 fL (ref 80.0–100.0)
Monocytes Absolute: 0.5 10*3/uL (ref 0.1–1.0)
Monocytes Relative: 7 %
Neutro Abs: 4.3 10*3/uL (ref 1.7–7.7)
Neutrophils Relative %: 69 %
Platelets: 187 10*3/uL (ref 150–400)
RBC: 5.45 MIL/uL (ref 4.22–5.81)
RDW: 13.2 % (ref 11.5–15.5)
WBC: 6.2 10*3/uL (ref 4.0–10.5)
nRBC: 0 % (ref 0.0–0.2)

## 2020-06-25 LAB — COMPREHENSIVE METABOLIC PANEL
ALT: 35 U/L (ref 0–44)
AST: 26 U/L (ref 15–41)
Albumin: 4.5 g/dL (ref 3.5–5.0)
Alkaline Phosphatase: 81 U/L (ref 38–126)
Anion gap: 10 (ref 5–15)
BUN: 19 mg/dL (ref 8–23)
CO2: 27 mmol/L (ref 22–32)
Calcium: 9.3 mg/dL (ref 8.9–10.3)
Chloride: 103 mmol/L (ref 98–111)
Creatinine, Ser: 0.91 mg/dL (ref 0.61–1.24)
GFR, Estimated: >60 mL/min (ref >60)
Glucose, Bld: 112 mg/dL — ABNORMAL HIGH (ref 70–99)
Potassium: 4.4 mmol/L (ref 3.5–5.1)
Sodium: 140 mmol/L (ref 135–145)
Total Bilirubin: 0.7 mg/dL (ref 0.3–1.2)
Total Protein: 7.7 g/dL (ref 6.5–8.1)

## 2020-06-25 LAB — LACTATE DEHYDROGENASE: LDH: 161 U/L (ref 98–192)

## 2020-06-25 LAB — URIC ACID: Uric Acid, Serum: 6.6 mg/dL (ref 3.7–8.6)

## 2020-08-30 ENCOUNTER — Ambulatory Visit (INDEPENDENT_AMBULATORY_CARE_PROVIDER_SITE_OTHER): Payer: BLUE CROSS/BLUE SHIELD

## 2020-08-30 ENCOUNTER — Other Ambulatory Visit: Payer: Self-pay

## 2020-08-30 ENCOUNTER — Ambulatory Visit
Admission: EM | Admit: 2020-08-30 | Discharge: 2020-08-30 | Disposition: A | Discharge status: Home / Self Care | Payer: BLUE CROSS/BLUE SHIELD | Attending: Family Medicine | Admitting: Family Medicine

## 2020-08-30 ENCOUNTER — Encounter: Payer: Self-pay | Admitting: Emergency Medicine

## 2020-08-30 DIAGNOSIS — M5136 Other intervertebral disc degeneration, lumbar region: Secondary | ICD-10-CM | POA: Diagnosis not present

## 2020-08-30 DIAGNOSIS — M5116 Intervertebral disc disorders with radiculopathy, lumbar region: Secondary | ICD-10-CM

## 2020-08-30 MED ORDER — HYDROCODONE-ACETAMINOPHEN 5-325 MG PO TABS: 1.0000 | ORAL_TABLET | Freq: Three times a day (TID) | ORAL | 0 refills | Status: DC | PRN

## 2020-08-30 MED ORDER — TIZANIDINE HCL 4 MG PO TABS: 4.0000 mg | ORAL_TABLET | Freq: Three times a day (TID) | ORAL | 0 refills | Status: DC | PRN

## 2020-08-30 MED ORDER — PREDNISONE 10 MG PO TABS: ORAL_TABLET | ORAL | 0 refills | Status: DC

## 2020-08-30 NOTE — ED Triage Notes (Signed)
Patient c/o lower back pain that radiates into his left hip that started couple of months ago.  Patient denies injury or fall.  Patient has been taking Tylenol for pain and is not helping.

## 2020-08-30 NOTE — ED Provider Notes (Signed)
[facility]    CSN: [id number] Arrival date & time: 08/30/20  1004  History   Chief Complaint Chief Complaint  Patient presents with  . Back Pain   HPI  65 year old male presents with chronic low back pain.  Patient reports he has had ongoing pain of his low back particularly on the left side for the past 6 months.  Has been worsening as of late.  He has been taking meloxicam and Tylenol without significant improvement.  He reports some pain of the lateral left leg.  He reports decreased range of motion.  His pain is 9/10 in severity.  Seems to be worse with activity.  Pain is constant.  Patient has a remote history of lymphoma.  He reports some night sweats.  No fever.  No weight loss.  Recent laboratory studies unremarkable.  Past Medical History:  Diagnosis Date  . Cancer ([place])   . Hypertension   . Lymphoma ([place]) 2013   chemo/radiation treatment   Patient Active Problem List   Diagnosis Date Noted  . Healthcare maintenance 06/30/2019  . Essential hypertension 09/23/2016  . Bilateral carpal tunnel syndrome 09/23/2016  . Cervical disc disease 09/23/2016  . Epicondylitis, lateral, left 09/23/2016  . Gastroesophageal reflux disease without esophagitis 09/23/2016  . Large cell lymphoma, extranodal and solid organ sites [facility]) 01/28/2016   Home Medications    Prior to Admission medications   Medication Sig Start Date End Date Taking? Authorizing Provider  acetaminophen (TYLENOL) 325 MG tablet Take 325 mg by mouth every 6 (six) hours as needed.   Yes [provider]  amLODipine (NORVASC) 10 MG tablet One a day 05/09/20  Yes Juline Patch, MD  HYDROcodone-acetaminophen (NORCO/VICODIN) 5-325 MG tablet Take 1 tablet by mouth every 8 (eight) hours as needed for moderate pain or severe pain. 08/30/20  Yes Violett Hobbs G, DO  omeprazole (PRILOSEC) 20 MG capsule Take 1 capsule (20 mg total) by mouth daily. otc 09/23/16  Yes Juline Patch, MD  predniSONE (DELTASONE)  10 MG tablet 50 mg daily x 2 days, then 40 mg daily x 2 days, then 30 mg daily x 2 days, then 20 mg daily x 2 days, then 10 mg daily x 2 days. 08/30/20  Yes Jadalynn Burr G, DO  tiZANidine (ZANAFLEX) 4 MG tablet Take 1 tablet (4 mg total) by mouth every 8 (eight) hours as needed for muscle spasms. 08/30/20  Yes Coral Spikes, DO    Family History Family History  Problem Relation Age of Onset  . Lung cancer Father   . Leukemia Maternal Uncle   . Melanoma Paternal Grandfather   . Colon cancer Cousin     Social History Social History   Tobacco Use  . Smoking status: Never Smoker  . Smokeless tobacco: Never Used  Vaping Use  . Vaping Use: Never used  Substance Use Topics  . Alcohol use: Yes    Comment: average 2 cans a month at most  . Drug use: No     Allergies   Patient has no known allergies.   Review of Systems Review of Systems  Constitutional: Negative.   Musculoskeletal: Positive for back pain.   Physical Exam Triage Vital Signs ED Triage Vitals  Enc Vitals Group     BP 08/30/20 1028 113/74     Pulse Rate 08/30/20 1028 72     Resp 08/30/20 1028 16     Temp 08/30/20 1028 97.7 F (36.5 C)     Temp  Source 08/30/20 1028 Oral     SpO2 08/30/20 1028 98 %     Weight 08/30/20 1026 240 lb (108.9 kg)     Height 08/30/20 1026 5\' 11"  (1.803 m)     Head Circumference --      Peak Flow --      Pain Score 08/30/20 1025 9     Pain Loc --      Pain Edu? --      Excl. in [place]? --    Updated Vital Signs BP 113/74 (BP Location: Left Arm)   Pulse 72   Temp 97.7 F (36.5 C) (Oral)   Resp 16   Ht 5\' 11"  (1.803 m)   Wt 108.9 kg   SpO2 98%   BMI 33.47 kg/m   Visual Acuity Right Eye Distance:   Left Eye Distance:   Bilateral Distance:    Right Eye Near:   Left Eye Near:    Bilateral Near:     Physical Exam Vitals and nursing note reviewed.  Constitutional:      General: He is not in acute distress.    Appearance: Normal appearance.  HENT:     Head: Normocephalic  and atraumatic.  Eyes:     General:        Right eye: No discharge.        Left eye: No discharge.     Conjunctiva/sclera: Conjunctivae normal.  Cardiovascular:     Rate and Rhythm: Normal rate and regular rhythm.  Pulmonary:     Effort: Pulmonary effort is normal.     Breath sounds: Normal breath sounds. No wheezing, rhonchi or rales.  Musculoskeletal:     Comments: Lumbar spine - mild swelling noted of the left paraspinal region.  Decreased range of motion secondary to pain  Neurological:     Mental Status: He is alert.  Psychiatric:        Mood and Affect: Mood normal.        Behavior: Behavior normal.    UC Treatments / Results  Labs (all labs ordered are listed, but only abnormal results are displayed) Labs Reviewed - No data to display  EKG   Radiology DG Lumbar Spine Complete  Result Date: 08/30/2020 CLINICAL DATA:  65 year old male with persistent back pain radiating to the left hip and thigh for several months. Progressive symptoms. EXAM: LUMBAR SPINE - COMPLETE 4+ VIEW COMPARISON:  Lumbar radiographs 02/09/2007. FINDINGS: Normal lumbar segmentation. Increased mild levoconvex lumbar scoliosis since 2008, severe L3-L4 disc space loss and endplate degeneration since that time. Vacuum disc at that level and at L4-L5 appears to be new. Less pronounced L1-L2 and L2-L3 disc space loss and endplate spurring since 2008. Up to moderate lumbar facet hypertrophy at L5-S1 has progressed. Visible sacrum and SI joints appear intact. No acute osseous abnormality identified. Mild Calcified aortic atherosclerosis. Negative other visible chest and abdominal visceral contours. Multiple new surgical clips in the pelvis. IMPRESSION: 1. No acute osseous abnormality identified in the lumbar spine. 2. Progressed lumbar disc and endplate degeneration since 2008, very severe at L3-L4. 3. Progressed and moderate lower lumbar facet degeneration. 4.  Aortic Atherosclerosis (ICD10-I70.0). Electronically  Signed   By: Genevie Ann M.D.   On: 08/30/2020 11:26    Procedures Procedures (including critical care time)  Medications Ordered in UC Medications - No data to display  Initial Impression / Assessment and Plan / UC Course  I have reviewed the triage vital signs and the nursing notes.  Pertinent  labs & imaging results that were available during my care of the patient were reviewed by me and considered in my medical decision making (see chart for details).    65 year old male presents with low back pain (chronic). Xray obtained and independently reviewed by me.  Interpretation: Severe lumbar disc disease at L3-L4.  Advised patient that he may need to see neurosurgery.  Information given.  Placing on prednisone and Zanaflex.  Vicodin as needed for moderate to severe pain.  Final Clinical Impressions(s) / UC Diagnoses   Final diagnoses:  Lumbar disc disease with radiculopathy   Discharge Instructions   None    ED Prescriptions    Medication Sig Dispense Auth. Provider   predniSONE (DELTASONE) 10 MG tablet 50 mg daily x 2 days, then 40 mg daily x 2 days, then 30 mg daily x 2 days, then 20 mg daily x 2 days, then 10 mg daily x 2 days. 30 tablet Betzayda Braxton G, DO   tiZANidine (ZANAFLEX) 4 MG tablet Take 1 tablet (4 mg total) by mouth every 8 (eight) hours as needed for muscle spasms. 30 tablet Kelcy Baeten G, DO   HYDROcodone-acetaminophen (NORCO/VICODIN) 5-325 MG tablet Take 1 tablet by mouth every 8 (eight) hours as needed for moderate pain or severe pain. 15 tablet Thersa Salt G, DO     I have reviewed the PDMP during this encounter.   Coral Spikes, [place] 08/30/20 1512

## 2020-09-02 ENCOUNTER — Ambulatory Visit: Payer: BLUE CROSS/BLUE SHIELD | Admitting: Family Medicine

## 2020-09-03 ENCOUNTER — Ambulatory Visit (INDEPENDENT_AMBULATORY_CARE_PROVIDER_SITE_OTHER): Payer: BLUE CROSS/BLUE SHIELD | Admitting: Family Medicine

## 2020-09-03 ENCOUNTER — Other Ambulatory Visit: Payer: Self-pay

## 2020-09-03 ENCOUNTER — Encounter: Payer: Self-pay | Admitting: Family Medicine

## 2020-09-03 VITALS — BP 124/80 | HR 84 | Ht 71.0 in | Wt 245.0 lb

## 2020-09-03 DIAGNOSIS — M5136 Other intervertebral disc degeneration, lumbar region: Secondary | ICD-10-CM

## 2020-09-03 MED ORDER — PREDNISONE 10 MG PO TABS
ORAL_TABLET | ORAL | 0 refills | Status: DC
Start: 2020-09-03 — End: 2020-11-07

## 2020-09-03 NOTE — Progress Notes (Signed)
Date:  09/03/2020   Name:  Nathan Gill   DOB:  [birth year]   MRN:  [id number]   Chief Complaint: Back Pain  Back Pain This is a chronic problem. The current episode started more than 1 month ago. The problem occurs daily. The problem is unchanged. The pain is present in the lumbar spine. The quality of the pain is described as aching. The pain is moderate. The symptoms are aggravated by bending and twisting. Associated symptoms include weakness. Pertinent negatives include no abdominal pain, bladder incontinence, bowel incontinence, chest pain, dysuria, fever, headaches or paresthesias.    Lab Results  Component Value Date   CREATININE 0.91 06/25/2020   BUN 19 06/25/2020   NA 140 06/25/2020   K 4.4 06/25/2020   CL 103 06/25/2020   CO2 27 06/25/2020   Lab Results  Component Value Date   CHOL 233 (H) 11/09/2018   HDL 44 11/09/2018   LDLCALC 161 (H) 11/09/2018   TRIG 138 11/09/2018   No results found for: TSH No results found for: HGBA1C Lab Results  Component Value Date   WBC 6.2 06/25/2020   HGB 15.7 06/25/2020   HCT 44.4 06/25/2020   MCV 81.5 06/25/2020   PLT 187 06/25/2020   Lab Results  Component Value Date   ALT 35 06/25/2020   AST 26 06/25/2020   ALKPHOS 81 06/25/2020   BILITOT 0.7 06/25/2020     Review of Systems  Constitutional: Negative for chills and fever.  HENT: Negative for drooling, ear discharge, ear pain and sore throat.   Respiratory: Negative for cough, shortness of breath and wheezing.   Cardiovascular: Negative for chest pain, palpitations and leg swelling.  Gastrointestinal: Negative for abdominal pain, blood in stool, bowel incontinence, constipation, diarrhea and nausea.  Endocrine: Negative for polydipsia.  Genitourinary: Negative for bladder incontinence, dysuria, frequency, hematuria and urgency.  Musculoskeletal: Positive for back pain. Negative for myalgias and neck pain.  Skin: Negative for rash.  Allergic/Immunologic: Negative for  environmental allergies.  Neurological: Positive for weakness. Negative for dizziness, headaches and paresthesias.  Hematological: Does not bruise/bleed easily.  Psychiatric/Behavioral: Negative for suicidal ideas. The patient is not nervous/anxious.     Patient Active Problem List   Diagnosis Date Noted  . Healthcare maintenance 06/30/2019  . Essential hypertension 09/23/2016  . Bilateral carpal tunnel syndrome 09/23/2016  . Cervical disc disease 09/23/2016  . Epicondylitis, lateral, left 09/23/2016  . Gastroesophageal reflux disease without esophagitis 09/23/2016  . Large cell lymphoma, extranodal and solid organ sites [facility]) 01/28/2016    No Known Allergies  No past surgical history on file.  Social History   Tobacco Use  . Smoking status: Never Smoker  . Smokeless tobacco: Never Used  Vaping Use  . Vaping Use: Never used  Substance Use Topics  . Alcohol use: Yes    Comment: average 2 cans a month at most  . Drug use: No     Medication list has been reviewed and updated.  Current Meds  Medication Sig  . acetaminophen (TYLENOL) 325 MG tablet Take 325 mg by mouth every 6 (six) hours as needed.  Marland Kitchen amLODipine (NORVASC) 10 MG tablet One a day  . HYDROcodone-acetaminophen (NORCO/VICODIN) 5-325 MG tablet Take 1 tablet by mouth every 8 (eight) hours as needed for moderate pain or severe pain.  Marland Kitchen omeprazole (PRILOSEC) 20 MG capsule Take 1 capsule (20 mg total) by mouth daily. otc  . predniSONE (DELTASONE) 10 MG tablet 50 mg daily x 2  days, then 40 mg daily x 2 days, then 30 mg daily x 2 days, then 20 mg daily x 2 days, then 10 mg daily x 2 days.  Marland Kitchen tiZANidine (ZANAFLEX) 4 MG tablet Take 1 tablet (4 mg total) by mouth every 8 (eight) hours as needed for muscle spasms.    PHQ 2/9 Scores 05/09/2020 06/22/2019 11/09/2018 02/15/2018  PHQ - 2 Score 0 0 0 0  PHQ- 9 Score 0 0 0 -    GAD 7 : Generalized Anxiety Score 05/09/2020 06/22/2019  Nervous, Anxious, on Edge 0 0   Control/stop worrying 0 0  Worry too much - different things 0 0  Trouble relaxing 0 0  Restless 0 0  Easily annoyed or irritable 0 0  Afraid - awful might happen 0 0  Total GAD 7 Score 0 0    BP Readings from Last 3 Encounters:  09/03/20 124/80  08/30/20 113/74  06/25/20 126/84    Physical Exam Vitals and nursing note reviewed.  HENT:     Head: Normocephalic.     Right Ear: Tympanic membrane, ear canal and external ear normal.     Left Ear: Tympanic membrane, ear canal and external ear normal.     Nose: Nose normal. No congestion or rhinorrhea.     Mouth/Throat:     Mouth: Mucous membranes are moist.  Eyes:     General: No scleral icterus.       Right eye: No discharge.        Left eye: No discharge.     Conjunctiva/sclera: Conjunctivae normal.     Pupils: Pupils are equal, round, and reactive to light.  Neck:     Thyroid: No thyromegaly.     Vascular: No JVD.     Trachea: No tracheal deviation.  Cardiovascular:     Rate and Rhythm: Normal rate and regular rhythm.     Heart sounds: Normal heart sounds. No murmur heard. No friction rub. No gallop.   Pulmonary:     Effort: No respiratory distress.     Breath sounds: Normal breath sounds. No wheezing, rhonchi or rales.  Abdominal:     General: Bowel sounds are normal.     Palpations: Abdomen is soft. There is no mass.     Tenderness: There is no abdominal tenderness. There is no guarding or rebound.  Musculoskeletal:        General: No tenderness. Normal range of motion.     Cervical back: Normal range of motion and neck supple.  Lymphadenopathy:     Cervical: No cervical adenopathy.  Skin:    General: Skin is warm.     Findings: No rash.  Neurological:     Mental Status: He is alert and oriented to person, place, and time.     Cranial Nerves: No cranial nerve deficit.     Deep Tendon Reflexes: Reflexes are normal and symmetric.     Wt Readings from Last 3 Encounters:  09/03/20 245 lb (111.1 kg)  08/30/20  240 lb (108.9 kg)  06/25/20 244 lb 4.3 oz (110.8 kg)    BP 124/80   Pulse 84   Ht 5\' 11"  (1.803 m)   Wt 245 lb (111.1 kg)   BMI 34.17 kg/m   Assessment and Plan:  1. Degeneration, intervertebral disc, lumbar Chronic.  Persistent.  Relatively stable.  However pain has remained to be unresolved despite combination of meloxicam/tizanidine/and prednisone.  We referred to Reche Dixon for evaluation of pain that has been  of a persistent nature for at least 6 months.  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  Pain is in the lumbar area and is minimally controlled with the above medication regimen.  We will refer to orthopedics for evaluation may be further imaging if necessary and consideration for injectable steroids per Dr. Sharlet Salina and/or physiotherapy. - predniSONE (DELTASONE) 10 MG tablet; 50 mg daily x 2 days, then 40 mg daily x 2 days, then 30 mg daily x 2 days, then 20 mg daily x 2 days, then 10 mg daily x 2 days.  Dispense: 30 tablet; Refill: 0

## 2020-09-16 ENCOUNTER — Telehealth: Payer: Self-pay | Admitting: *Deleted

## 2020-09-16 NOTE — Telephone Encounter (Signed)
Per Dr. C-unlikely related to his history of lymphoma. She would recommend that patient be evaluated by neuro to determine if MRI is warranted.

## 2020-09-16 NOTE — Telephone Encounter (Signed)
Please advise 

## 2020-09-16 NOTE — Telephone Encounter (Signed)
Call returned to patient and advised of doctor response, questions were answeted. He thanked me for calling back

## 2020-09-16 NOTE — Telephone Encounter (Signed)
Patient called reporting that he is having back issues and it is worrying him that he may have cancer there and would like to speak with Dr Mike Gip about it.  Of note, he saw ER physician 08/30/20 and Dr Ronnald Ramp about this on 09/03/20  IMPRESSION: 1. No acute osseous abnormality identified in the lumbar spine. 2. Progressed lumbar disc and endplate degeneration since 2008, very severe at L3-L4. 3. Progressed and moderate lower lumbar facet degeneration. 4.  Aortic Atherosclerosis (ICD10-I70.0).   Electronically Signed   By: Genevie Ann M.D.   On: 08/30/2020 11:26  Assessment and Plan:  1. Degeneration, intervertebral disc, lumbar Chronic.  Persistent.  Relatively stable.  However pain has remained to be unresolved despite combination of meloxicam/tizanidine/and prednisone.  We referred to Reche Dixon for evaluation of pain that has been of a persistent nature for at least 6 months.  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  Pain is in the lumbar area and is minimally controlled with the above medication regimen.  We will refer to orthopedics for evaluation may be further imaging if necessary and consideration for injectable steroids per Dr. Sharlet Salina and/or physiotherapy. - predniSONE (DELTASONE) 10 MG tablet; 50 mg daily x 2 days, then 40 mg daily x 2 days, then 30 mg daily x 2 days, then 20 mg daily x 2 days, then 10 mg daily x 2 days.  Dispense: 30 tablet; Refill: 0         Instructions   After Visit Summary (Printed 09/03/2020)    Media From this encounter Electronic signature on 09/03/2020 3:16 PM - 1 of 3 e-signatures recorded

## 2020-11-07 ENCOUNTER — Other Ambulatory Visit: Payer: Self-pay

## 2020-11-07 ENCOUNTER — Ambulatory Visit (INDEPENDENT_AMBULATORY_CARE_PROVIDER_SITE_OTHER): Payer: BLUE CROSS/BLUE SHIELD | Admitting: Family Medicine

## 2020-11-07 ENCOUNTER — Encounter: Payer: Self-pay | Admitting: Family Medicine

## 2020-11-07 VITALS — BP 114/72 | HR 80 | Ht 71.0 in | Wt 245.0 lb

## 2020-11-07 DIAGNOSIS — I10 Essential (primary) hypertension: Secondary | ICD-10-CM

## 2020-11-07 DIAGNOSIS — M5136 Other intervertebral disc degeneration, lumbar region: Secondary | ICD-10-CM

## 2020-11-07 MED ORDER — TRAMADOL HCL 50 MG PO TABS
50.0000 mg | ORAL_TABLET | Freq: Four times a day (QID) | ORAL | 0 refills | Status: DC | PRN
Start: 2020-11-07 — End: 2021-09-12

## 2020-11-07 MED ORDER — AMLODIPINE BESYLATE 10 MG PO TABS: ORAL_TABLET | ORAL | 1 refills | Status: DC

## 2020-11-07 NOTE — Progress Notes (Signed)
Name: Nathan Gill   MRN: [id number]    DOB: 25-Jun-1955   Date:11/07/2020       Progress Note  Subjective  Chief Complaint      Hypertension This is a chronic problem. The current episode started more than 1 year ago. The problem has been gradually improving since onset. The problem is controlled. Pertinent negatives include no anxiety, blurred vision, chest pain, headaches, malaise/fatigue, neck pain, orthopnea, palpitations, peripheral edema, PND, shortness of breath or sweats. There are no associated agents to hypertension. Risk factors for coronary artery disease include dyslipidemia and male gender. Past treatments include calcium channel blockers. The current treatment provides moderate improvement. There is no history of angina, kidney disease, CAD/MI, CVA, heart failure, left ventricular hypertrophy or PVD. There is no history of chronic renal disease, a hypertension causing med or renovascular disease.  Back Pain This is a recurrent problem. The current episode started more than 1 month ago. The problem has been waxing and waning since onset. The pain is present in the lumbar spine. The quality of the pain is described as aching. The pain is moderate. The symptoms are aggravated by bending and twisting. Pertinent negatives include no abdominal pain, bladder incontinence, bowel incontinence, chest pain, dysuria, fever, headaches, numbness, paresis, paresthesias, tingling, weakness or weight loss. He has tried analgesics and NSAIDs for the symptoms. The treatment provided moderate relief.     No problem-specific Assessment & Plan notes found for this encounter.   Past Medical History:  Diagnosis Date  . Cancer ([place])   . Hypertension   . Lymphoma ([place]) 2013   chemo/radiation treatment    History reviewed. No pertinent surgical history.  Family History  Problem Relation Age of Onset  . Lung cancer Father   . Leukemia Maternal Uncle   . Melanoma Paternal Grandfather   . Colon  cancer Cousin     Social History   Socioeconomic History  . Marital status: Divorced    Spouse name: Not on file  . Number of children: Not on file  . Years of education: Not on file  . Highest education level: Not on file  Occupational History  . Not on file  Tobacco Use  . Smoking status: Never Smoker  . Smokeless tobacco: Never Used  Vaping Use  . Vaping Use: Never used  Substance and Sexual Activity  . Alcohol use: Yes    Comment: average 2 cans a month at most  . Drug use: No  . Sexual activity: Not on file  Other Topics Concern  . Not on file  Social History Narrative  . Not on file   Social Determinants of Health   Financial Resource Strain: Not on file  Food Insecurity: Not on file  Transportation Needs: Not on file  Physical Activity: Not on file  Stress: Not on file  Social Connections: Not on file  Intimate Partner Violence: Not on file    No Known Allergies   Review of Systems  Constitutional: Negative for chills, fever, malaise/fatigue and weight loss.  HENT: Negative for ear discharge, ear pain and sore throat.   Eyes: Negative for blurred vision.  Respiratory: Negative for cough, sputum production, shortness of breath and wheezing.   Cardiovascular: Negative for chest pain, palpitations, orthopnea, leg swelling and PND.  Gastrointestinal: Negative for abdominal pain, blood in stool, bowel incontinence, constipation, diarrhea, heartburn, melena and nausea.  Genitourinary: Negative for bladder incontinence, dysuria, frequency, hematuria and urgency.  Musculoskeletal: Negative for back pain, joint pain,  myalgias and neck pain.  Skin: Negative for rash.  Neurological: Negative for dizziness, tingling, sensory change, focal weakness, weakness, numbness, headaches and paresthesias.  Endo/Heme/Allergies: Negative for environmental allergies and polydipsia. Does not bruise/bleed easily.  Psychiatric/Behavioral: Negative for depression and suicidal ideas.  The patient is not nervous/anxious and does not have insomnia.      Objective  Vitals:   11/07/20 0932  BP: 114/72  Pulse: 80  Weight: 245 lb (111.1 kg)  Height: 5\' 11"  (1.803 m)    Physical Exam Vitals and nursing note reviewed.  HENT:     Head: Normocephalic.     Right Ear: Tympanic membrane and external ear normal.     Left Ear: Tympanic membrane and external ear normal.     Nose: Nose normal. No congestion or rhinorrhea.     Mouth/Throat:     Mouth: Mucous membranes are moist.  Eyes:     General: No scleral icterus.       Right eye: No discharge.        Left eye: No discharge.     Conjunctiva/sclera: Conjunctivae normal.     Pupils: Pupils are equal, round, and reactive to light.  Neck:     Thyroid: No thyromegaly.     Vascular: No JVD.     Trachea: No tracheal deviation.  Cardiovascular:     Rate and Rhythm: Normal rate and regular rhythm.     Heart sounds: Normal heart sounds. No murmur heard. No friction rub. No gallop.   Pulmonary:     Effort: No respiratory distress.     Breath sounds: Normal breath sounds. No wheezing, rhonchi or rales.  Abdominal:     General: Bowel sounds are normal.     Palpations: Abdomen is soft. There is no mass.     Tenderness: There is no abdominal tenderness. There is no guarding or rebound.  Musculoskeletal:        General: No tenderness. Normal range of motion.     Cervical back: Normal range of motion and neck supple.  Lymphadenopathy:     Cervical: No cervical adenopathy.  Skin:    General: Skin is warm.     Findings: No rash.  Neurological:     Mental Status: He is alert and oriented to person, place, and time.     Cranial Nerves: No cranial nerve deficit.     Deep Tendon Reflexes: Reflexes are normal and symmetric.       Assessment & Plan  1. Essential hypertension Chronic.  Controlled.  Stable.  Continue amlodipine 10 mg once a day.  We will check renal function panel for evaluation of electrolytes and GFR. -  amLODipine (NORVASC) 10 MG tablet; One a day  Dispense: 90 tablet; Refill: 1 - Renal Function Panel  2. Degeneration, intervertebral disc, lumbar Chronic.  Controlled.  Stable.  Patient is in courage to continue Tylenol and/or ibuprofen such as meloxicam.  Patient also has been refilled on his tramadol to use on significant pain particularly at night. - traMADol (ULTRAM) 50 MG tablet; Take 1 tablet (50 mg total) by mouth every 6 (six) hours as needed.  Dispense: 30 tablet; Refill: 0 Problem List Items Addressed This Visit      Cardiovascular and Mediastinum   Essential hypertension        Dr. Otilio Miu [facility]  11/07/20

## 2020-12-18 ENCOUNTER — Other Ambulatory Visit: Payer: Self-pay | Admitting: Family Medicine

## 2020-12-18 DIAGNOSIS — G5602 Carpal tunnel syndrome, left upper limb: Secondary | ICD-10-CM

## 2020-12-18 NOTE — Telephone Encounter (Signed)
   Notes to clinic:  medication requested has been d/c  Review for continued use    Requested Prescriptions  Pending Prescriptions Disp Refills   meloxicam (MOBIC) 15 MG tablet [Pharmacy Med Name: MELOXICAM 15 MG TAB] 90 tablet 1    Sig: TAKE (1) TABLET BY MOUTH EVERY DAY      Analgesics:  COX2 Inhibitors Passed - 12/18/2020 11:45 AM      Passed - HGB in normal range and within 360 days    Hemoglobin  Date Value Ref Range Status  06/25/2020 15.7 13.0 - 17.0 g/dL Final  11/09/2018 15.5 13.0 - 17.7 g/dL Final          Passed - Cr in normal range and within 360 days    Creatinine  Date Value Ref Range Status  07/26/2014 1.16 0.60 - 1.30 mg/dL Final   Creatinine, Ser  Date Value Ref Range Status  06/25/2020 0.91 0.61 - 1.24 mg/dL Final          Passed - Patient is not pregnant      Passed - Valid encounter within last 12 months    Recent Outpatient Visits           1 month ago Essential hypertension   [facility] Juline Patch, MD   3 months ago Degeneration, intervertebral disc, lumbar   [facility] Juline Patch, MD   7 months ago Essential hypertension   [facility] Juline Patch, MD   1 year ago Essential hypertension   [facility] Juline Patch, MD   2 years ago Biceps tendon rupture, proximal, right, initial encounter   [facility] Juline Patch, MD

## 2021-03-07 ENCOUNTER — Other Ambulatory Visit: Payer: Self-pay

## 2021-03-07 DIAGNOSIS — Z1211 Encounter for screening for malignant neoplasm of colon: Secondary | ICD-10-CM

## 2021-03-07 NOTE — Progress Notes (Signed)
Ref to GI placed for colonoscopy

## 2021-03-10 ENCOUNTER — Other Ambulatory Visit: Payer: Self-pay

## 2021-03-10 DIAGNOSIS — Z1211 Encounter for screening for malignant neoplasm of colon: Secondary | ICD-10-CM

## 2021-03-10 MED ORDER — PEG 3350-KCL-NA BICARB-NACL 420 G PO SOLR: 4000.0000 mL | Freq: Once | ORAL | 0 refills | Status: AC

## 2021-03-10 NOTE — Progress Notes (Signed)
Gastroenterology Pre-Procedure Review  Request Date: 04/25/21 Requesting Physician: Dr. Allen Norris  PATIENT REVIEW QUESTIONS: The patient responded to the following health history questions as indicated:    1. Are you having any GI issues? no 2. Do you have a personal history of Polyps? no 3. Do you have a family history of Colon Cancer or Polyps? no 4. Diabetes Mellitus? no 5. Joint replacements in the past 12 months?no 6. Major health problems in the past 3 months?no 7. Any artificial heart valves, MVP, or defibrillator?no    MEDICATIONS & ALLERGIES:    Patient reports the following regarding taking any anticoagulation/antiplatelet therapy:   Plavix, Coumadin, Eliquis, Xarelto, Lovenox, Pradaxa, Brilinta, or Effient? no Aspirin? no  Patient confirms/reports the following medications:  Current Outpatient Medications  Medication Sig Dispense Refill   acetaminophen (TYLENOL) 325 MG tablet Take 325 mg by mouth every 6 (six) hours as needed.     amLODipine (NORVASC) 10 MG tablet One a day 90 tablet 1   gabapentin (NEURONTIN) 300 MG capsule Take 300 mg by mouth at bedtime. For 60 doses     HYDROcodone-acetaminophen (NORCO/VICODIN) 5-325 MG tablet Take 1 tablet by mouth every 8 (eight) hours as needed for moderate pain or severe pain. 15 tablet 0   omeprazole (PRILOSEC) 20 MG capsule Take 1 capsule (20 mg total) by mouth daily. otc 90 capsule 3   tiZANidine (ZANAFLEX) 4 MG tablet Take 1 tablet (4 mg total) by mouth every 8 (eight) hours as needed for muscle spasms. 30 tablet 0   traMADol (ULTRAM) 50 MG tablet Take 1 tablet (50 mg total) by mouth every 6 (six) hours as needed. 30 tablet 0   No current facility-administered medications for this visit.    Patient confirms/reports the following allergies:  No Known Allergies  No orders of the defined types were placed in this encounter.   AUTHORIZATION INFORMATION Primary Insurance: 1D#: Group #:  Secondary Insurance: 1D#: Group  #:  SCHEDULE INFORMATION: Date: 04/25/21 Time: Location: [place]

## 2021-03-18 DIAGNOSIS — Z85828 Personal history of other malignant neoplasm of skin: Secondary | ICD-10-CM | POA: Diagnosis not present

## 2021-03-18 DIAGNOSIS — D2262 Melanocytic nevi of left upper limb, including shoulder: Secondary | ICD-10-CM | POA: Diagnosis not present

## 2021-03-18 DIAGNOSIS — D2271 Melanocytic nevi of right lower limb, including hip: Secondary | ICD-10-CM | POA: Diagnosis not present

## 2021-03-18 DIAGNOSIS — D2261 Melanocytic nevi of right upper limb, including shoulder: Secondary | ICD-10-CM | POA: Diagnosis not present

## 2021-04-08 ENCOUNTER — Encounter: Payer: Self-pay | Admitting: Gastroenterology

## 2021-04-25 ENCOUNTER — Other Ambulatory Visit: Payer: Self-pay

## 2021-04-25 ENCOUNTER — Ambulatory Visit: Payer: Medicare Other | Admitting: Anesthesiology

## 2021-04-25 ENCOUNTER — Ambulatory Visit
Admission: RE | Admit: 2021-04-25 | Discharge: 2021-04-25 | Disposition: A | Discharge status: Home / Self Care | Payer: Medicare Other | Attending: Gastroenterology | Admitting: Gastroenterology

## 2021-04-25 ENCOUNTER — Encounter
Admission: RE | Disposition: A | Discharge status: Home / Self Care | Payer: Self-pay | Source: Home / Self Care | Attending: Gastroenterology

## 2021-04-25 ENCOUNTER — Encounter: Payer: Self-pay | Admitting: Gastroenterology

## 2021-04-25 DIAGNOSIS — D122 Benign neoplasm of ascending colon: Secondary | ICD-10-CM | POA: Diagnosis not present

## 2021-04-25 DIAGNOSIS — K635 Polyp of colon: Secondary | ICD-10-CM

## 2021-04-25 DIAGNOSIS — I10 Essential (primary) hypertension: Secondary | ICD-10-CM | POA: Insufficient documentation

## 2021-04-25 DIAGNOSIS — K640 First degree hemorrhoids: Secondary | ICD-10-CM | POA: Diagnosis not present

## 2021-04-25 DIAGNOSIS — Z1211 Encounter for screening for malignant neoplasm of colon: Secondary | ICD-10-CM

## 2021-04-25 DIAGNOSIS — D123 Benign neoplasm of transverse colon: Secondary | ICD-10-CM | POA: Diagnosis not present

## 2021-04-25 DIAGNOSIS — Z8572 Personal history of non-Hodgkin lymphomas: Secondary | ICD-10-CM | POA: Insufficient documentation

## 2021-04-25 DIAGNOSIS — K219 Gastro-esophageal reflux disease without esophagitis: Secondary | ICD-10-CM | POA: Insufficient documentation

## 2021-04-25 HISTORY — DX: Presence of external hearing-aid: Z97.4

## 2021-04-25 HISTORY — DX: Gastro-esophageal reflux disease without esophagitis: K21.9

## 2021-04-25 HISTORY — DX: Unspecified osteoarthritis, unspecified site: M19.90

## 2021-04-25 HISTORY — PX: COLONOSCOPY WITH PROPOFOL: SHX5780

## 2021-04-25 HISTORY — PX: POLYPECTOMY: SHX5525

## 2021-04-25 SURGERY — COLONOSCOPY WITH PROPOFOL
Anesthesia: General | Site: Rectum

## 2021-04-25 MED ORDER — ACETAMINOPHEN 160 MG/5ML PO SOLN: 325.0000 mg | ORAL | Status: DC | PRN

## 2021-04-25 MED ORDER — LACTATED RINGERS IV SOLN: INTRAVENOUS | Status: DC

## 2021-04-25 MED ORDER — ONDANSETRON HCL 4 MG/2ML IJ SOLN: 4.0000 mg | Freq: Once | INTRAMUSCULAR | Status: DC | PRN

## 2021-04-25 MED ORDER — SODIUM CHLORIDE 0.9 % IV SOLN: INTRAVENOUS | Status: DC

## 2021-04-25 MED ORDER — LIDOCAINE HCL (CARDIAC) PF 100 MG/5ML IV SOSY
PREFILLED_SYRINGE | INTRAVENOUS | Status: DC | PRN
  Administered 2021-04-25: 50 mg via INTRAVENOUS

## 2021-04-25 MED ORDER — STERILE WATER FOR IRRIGATION IR SOLN
Status: DC | PRN
  Administered 2021-04-25: 60 mL

## 2021-04-25 MED ORDER — ACETAMINOPHEN 325 MG PO TABS: 325.0000 mg | ORAL_TABLET | ORAL | Status: DC | PRN

## 2021-04-25 MED ORDER — STERILE WATER FOR IRRIGATION IR SOLN
Status: DC | PRN
  Administered 2021-04-25: 1

## 2021-04-25 MED ORDER — PROPOFOL 10 MG/ML IV BOLUS
INTRAVENOUS | Status: DC | PRN
  Administered 2021-04-25: 30 mg via INTRAVENOUS
  Administered 2021-04-25: 50 mg via INTRAVENOUS
  Administered 2021-04-25: 70 mg via INTRAVENOUS
  Administered 2021-04-25: 50 mg via INTRAVENOUS

## 2021-04-25 SURGICAL SUPPLY — 7 items
FORCEPS BIOP RAD 4 LRG CAP 4 (CUTTING FORCEPS) ×1 IMPLANT
GOWN CVR UNV OPN BCK APRN NK (MISCELLANEOUS) ×2 IMPLANT
GOWN ISOL THUMB LOOP REG UNIV (MISCELLANEOUS) ×4
KIT PRC NS LF DISP ENDO (KITS) ×1 IMPLANT
KIT PROCEDURE OLYMPUS (KITS) ×2
MANIFOLD NEPTUNE II (INSTRUMENTS) ×2 IMPLANT
WATER STERILE IRR 250ML POUR (IV SOLUTION) ×2 IMPLANT

## 2021-04-25 NOTE — Op Note (Signed)
[facility] Gastroenterology Patient Name: [facility] Procedure Date: 04/25/2021 10:32 AM MRN: [id number] Account #: [id number] Date of Birth: 1956-03-29 Admit Type: Outpatient Age: 65 Room: [facility] OR ROOM 01 Gender: Male Note Status: Finalized Instrument Name: [id number] Procedure:             Colonoscopy Indications:           Screening for colorectal malignant neoplasm Providers:             Lucilla Lame MD, MD Referring MD:          Juline Patch, MD (Referring MD) Medicines:             Propofol per Anesthesia Complications:         No immediate complications. Procedure:             Pre-Anesthesia Assessment:                        - Prior to the procedure, a History and Physical was                         performed, and patient medications and allergies were                         reviewed. The patient's tolerance of previous                         anesthesia was also reviewed. The risks and benefits                         of the procedure and the sedation options and risks                         were discussed with the patient. All questions were                         answered, and informed consent was obtained. Prior                         Anticoagulants: The patient has taken no previous                         anticoagulant or antiplatelet agents. ASA Grade                         Assessment: II - A patient with mild systemic disease.                         After reviewing the risks and benefits, the patient                         was deemed in satisfactory condition to undergo the                         procedure.                        After obtaining informed consent, the colonoscope was  passed under direct vision. Throughout the procedure,                         the patient's blood pressure, pulse, and oxygen                         saturations were monitored continuously. The                         Colonoscope was  introduced through the anus and                         advanced to the the cecum, identified by appendiceal                         orifice and ileocecal valve. The colonoscopy was                         performed without difficulty. The patient tolerated                         the procedure well. The quality of the bowel                         preparation was excellent. Findings:      The perianal and digital rectal examinations were normal.      A 3 mm polyp was found in the ascending colon. The polyp was sessile.       The polyp was removed with a cold biopsy forceps. Resection and       retrieval were complete.      A 2 mm polyp was found in the hepatic flexure. The polyp was sessile.       The polyp was removed with a cold biopsy forceps. Resection and       retrieval were complete.      Non-bleeding internal hemorrhoids were found during retroflexion. The       hemorrhoids were Grade I (internal hemorrhoids that do not prolapse). Impression:            - One 3 mm polyp in the ascending colon, removed with                         a cold biopsy forceps. Resected and retrieved.                        - One 2 mm polyp at the hepatic flexure, removed with                         a cold biopsy forceps. Resected and retrieved.                        - Non-bleeding internal hemorrhoids. Recommendation:        - Discharge patient to home.                        - Resume previous diet.                        - Continue present medications.                        -  Await pathology results.                        - If the pathology report reveals adenomatous tissue,                         then repeat the colonoscopy for surveillance in 7                         years. Procedure Code(s):     --- Professional ---                        [phone], Colonoscopy, flexible; with biopsy, single or                         multiple Diagnosis Code(s):     --- Professional ---                         Z12.11, Encounter for screening for malignant neoplasm                         of colon                        K63.5, Polyp of colon CPT copyright 2019 [employer]. All rights reserved. The codes documented in this report are preliminary and upon coder review may  be revised to meet current compliance requirements. Lucilla Lame MD, MD 04/25/2021 11:05:01 AM This report has been signed electronically. Number of Addenda: 0 Note Initiated On: 04/25/2021 10:32 AM Scope Withdrawal Time: 0 hours 9 minutes 16 seconds  Total Procedure Duration: 0 hours 12 minutes 38 seconds  Estimated Blood Loss:  Estimated blood loss: none.      [facility]

## 2021-04-25 NOTE — H&P (Signed)
Lucilla Lame, MD [address] [place], [place] [address] Phone: [phone] Fax : [phone]  Primary Care Physician:  Juline Patch, MD Primary Gastroenterologist:  Dr. Allen Norris  Pre-Procedure History & Physical: HPI:  Nathan Gill is a 65 y.o. male is here for a screening colonoscopy.   Past Medical History:  Diagnosis Date   Arthritis    Elbows, wrists, knees   Cancer (HCC)    GERD (gastroesophageal reflux disease)    Hypertension    Lymphoma ([facility]) 06/23/2011   chemo/radiation treatment   Wears hearing aid in left ear     Past Surgical History:  Procedure Laterality Date   EXTERNAL EAR SURGERY     KNEE ARTHROSCOPY     TESTICLE SURGERY      Prior to Admission medications   Medication Sig Start Date End Date Taking? Authorizing Provider  acetaminophen (TYLENOL) 325 MG tablet Take 325 mg by mouth every 6 (six) hours as needed.   Yes [provider]  amLODipine (NORVASC) 10 MG tablet One a day 11/07/20  Yes Juline Patch, MD  meloxicam (MOBIC) 15 MG tablet Take 15 mg by mouth daily.   Yes [provider]  omeprazole (PRILOSEC) 20 MG capsule Take 1 capsule (20 mg total) by mouth daily. otc 09/23/16  Yes Juline Patch, MD  gabapentin (NEURONTIN) 300 MG capsule Take 300 mg by mouth at bedtime. For 60 doses Patient not taking: Reported on 04/25/2021 10/22/20 04/08/21  [provider]  HYDROcodone-acetaminophen (NORCO/VICODIN) 5-325 MG tablet Take 1 tablet by mouth every 8 (eight) hours as needed for moderate pain or severe pain. Patient not taking: Reported on 04/08/2021 08/30/20   Coral Spikes, DO  tiZANidine (ZANAFLEX) 4 MG tablet Take 1 tablet (4 mg total) by mouth every 8 (eight) hours as needed for muscle spasms. Patient not taking: Reported on 04/08/2021 08/30/20   Coral Spikes, DO  traMADol (ULTRAM) 50 MG tablet Take 1 tablet (50 mg total) by mouth every 6 (six) hours as needed. Patient not taking: Reported on 04/08/2021 11/07/20    Juline Patch, MD    Allergies as of 03/10/2021   (No Known Allergies)    Family History  Problem Relation Age of Onset   Lung cancer Father    Leukemia Maternal Uncle    Melanoma Paternal Grandfather    Colon cancer Cousin     Social History   Socioeconomic History   Marital status: Divorced    Spouse name: Not on file   Number of children: Not on file   Years of education: Not on file   Highest education level: Not on file  Occupational History   Not on file  Tobacco Use   Smoking status: Never   Smokeless tobacco: Never  Vaping Use   Vaping Use: Never used  Substance and Sexual Activity   Alcohol use: Yes    Comment: average 2 cans a month at most   Drug use: No   Sexual activity: Not on file  Other Topics Concern   Not on file  Social History Narrative   Not on file   Social Determinants of Health   Financial Resource Strain: Not on file  Food Insecurity: Not on file  Transportation Needs: Not on file  Physical Activity: Not on file  Stress: Not on file  Social Connections: Not on file  Intimate Partner Violence: Not on file    Review of Systems: See HPI, otherwise negative ROS  Physical Exam: BP (!) 145/95   Pulse 72   Temp 98.1 F (36.7 C)   Resp 11   Ht 5\' 11"  (1.803 m)   Wt 109.8 kg   SpO2 98%   BMI 33.75 kg/m  General:   Alert,  pleasant and cooperative in NAD Head:  Normocephalic and atraumatic. Neck:  Supple; no masses or thyromegaly. Lungs:  Clear throughout to auscultation.    Heart:  Regular rate and rhythm. Abdomen:  Soft, nontender and nondistended. Normal bowel sounds, without guarding, and without rebound.   Neurologic:  Alert and  oriented x4;  grossly normal neurologically.  Impression/Plan: Nathan Gill is now here to undergo a screening colonoscopy.  Risks, benefits, and alternatives regarding colonoscopy have been reviewed with the patient.  Questions have been answered.  All parties agreeable.

## 2021-04-25 NOTE — Anesthesia Preprocedure Evaluation (Signed)
Anesthesia Evaluation  Patient identified by MRN, date of birth, ID band Patient awake    Reviewed: Allergy & Precautions, H&P , NPO status , Patient's Chart, lab work & pertinent test results, reviewed documented beta blocker date and time   Airway Mallampati: II  TM Distance: >3 FB Neck ROM: full    Dental no notable dental hx.    Pulmonary neg pulmonary ROS,    Pulmonary exam normal breath sounds clear to auscultation       Cardiovascular Exercise Tolerance: Good hypertension, negative cardio ROS   Rhythm:regular Rate:Normal     Neuro/Psych negative neurological ROS  negative psych ROS   GI/Hepatic Neg liver ROS, GERD  ,  Endo/Other  negative endocrine ROS  Renal/GU negative Renal ROS  negative genitourinary   Musculoskeletal   Abdominal   Peds  Hematology negative hematology ROS (+)   Anesthesia Other Findings Lymphoma  Reproductive/Obstetrics negative OB ROS                             Anesthesia Physical Anesthesia Plan  ASA: 2  Anesthesia Plan: General   Post-op Pain Management:    Induction:   PONV Risk Score and Plan: Propofol infusion, TIVA and Treatment may vary due to age or medical condition  Airway Management Planned:   Additional Equipment:   Intra-op Plan:   Post-operative Plan:   Informed Consent: I have reviewed the patients History and Physical, chart, labs and discussed the procedure including the risks, benefits and alternatives for the proposed anesthesia with the patient or authorized representative who has indicated his/her understanding and acceptance.     Dental Advisory Given  Plan Discussed with: CRNA  Anesthesia Plan Comments:         Anesthesia Quick Evaluation

## 2021-04-25 NOTE — Anesthesia Postprocedure Evaluation (Signed)
Anesthesia Post Note  Patient: [employer]  Procedure(s) Performed: COLONOSCOPY WITH BIOPSY (Rectum) POLYPECTOMY (Rectum)     Patient location during evaluation: PACU Anesthesia Type: General Level of consciousness: awake and alert Pain management: pain level controlled Vital Signs Assessment: post-procedure vital signs reviewed and stable Respiratory status: spontaneous breathing, nonlabored ventilation and respiratory function stable Cardiovascular status: blood pressure returned to baseline and stable Postop Assessment: no apparent nausea or vomiting Anesthetic complications: no   No notable events documented.  April Manson

## 2021-04-25 NOTE — Transfer of Care (Signed)
Immediate Anesthesia Transfer of Care Note  Patient: [facility]  Procedure(s) Performed: COLONOSCOPY WITH BIOPSY (Rectum) POLYPECTOMY (Rectum)  Patient Location: PACU  Anesthesia Type: General  Level of Consciousness: awake, alert  and patient cooperative  Airway and Oxygen Therapy: Patient Spontanous Breathing and Patient connected to supplemental oxygen  Post-op Assessment: Post-op Vital signs reviewed, Patient's Cardiovascular Status Stable, Respiratory Function Stable, Patent Airway and No signs of Nausea or vomiting  Post-op Vital Signs: Reviewed and stable  Complications: No notable events documented.

## 2021-04-28 ENCOUNTER — Encounter: Payer: Self-pay | Admitting: Gastroenterology

## 2021-04-29 LAB — SURGICAL PATHOLOGY

## 2021-04-30 ENCOUNTER — Encounter: Payer: Self-pay | Admitting: Gastroenterology

## 2021-05-19 ENCOUNTER — Other Ambulatory Visit: Payer: Self-pay | Admitting: Family Medicine

## 2021-05-19 DIAGNOSIS — I10 Essential (primary) hypertension: Secondary | ICD-10-CM

## 2021-06-24 ENCOUNTER — Telehealth: Payer: Self-pay | Admitting: Family Medicine

## 2021-06-24 ENCOUNTER — Telehealth: Payer: Medicare Other | Admitting: Physician Assistant

## 2021-06-24 ENCOUNTER — Ambulatory Visit: Payer: Self-pay

## 2021-06-24 DIAGNOSIS — U071 COVID-19: Secondary | ICD-10-CM | POA: Diagnosis not present

## 2021-06-24 MED ORDER — MOLNUPIRAVIR EUA 200MG CAPSULE: 4.0000 | ORAL_CAPSULE | Freq: Two times a day (BID) | ORAL | 0 refills | Status: DC

## 2021-06-24 MED ORDER — MOLNUPIRAVIR EUA 200MG CAPSULE: 4.0000 | ORAL_CAPSULE | Freq: Two times a day (BID) | ORAL | 0 refills | Status: AC

## 2021-06-24 NOTE — Progress Notes (Signed)
Virtual Visit Consent   Nathan Gill, you are scheduled for a virtual visit with a [facility] provider today.     Just as with appointments in the office, your consent must be obtained to participate.  Your consent will be active for this visit and any virtual visit you may have with one of our providers in the next 365 days.     If you have a MyChart account, a copy of this consent can be sent to you electronically.  All virtual visits are billed to your insurance company just like a traditional visit in the office.    As this is a virtual visit, video technology does not allow for your provider to perform a traditional examination.  This may limit your provider's ability to fully assess your condition.  If your provider identifies any concerns that need to be evaluated in person or the need to arrange testing (such as labs, EKG, etc.), we will make arrangements to do so.     Although advances in technology are sophisticated, we cannot ensure that it will always work on either your end or our end.  If the connection with a video visit is poor, the visit may have to be switched to a telephone visit.  With either a video or telephone visit, we are not always able to ensure that we have a secure connection.     I need to obtain your verbal consent now.   Are you willing to proceed with your visit today?    [facility] has provided verbal consent on 06/24/2021 for a virtual visit (video or telephone).   Mar Daring, PA-C   Date: 06/24/2021 2:37 PM   Virtual Visit via Video Note   I, Mar Daring, connected with  Nathan Gill  ([id number], [birth year]) on 06/24/21 at  2:30 PM EST by a video-enabled telemedicine application and verified that I am speaking with the correct person using two identifiers.  Location: Patient: Virtual Visit Location Patient: Home Provider: Virtual Visit Location Provider: Home Office   I discussed the limitations of evaluation and management by  telemedicine and the availability of in person appointments. The patient expressed understanding and agreed to proceed.    History of Present Illness: Nathan Gill is a 66 y.o. who identifies as a male who was assigned male at birth, and is being seen today for Covid 62.  HPI: URI  This is a new problem. Episode onset: Saturday afternoon, tested positive for Covid 19 on Sunday. The problem has been gradually worsening. Maximum temperature: subjective fever. Associated symptoms include congestion, coughing, headaches, rhinorrhea, sinus pain and a sore throat. Pertinent negatives include no diarrhea, ear pain, nausea, plugged ear sensation or vomiting. Associated symptoms comments: Post nasal drainage, body aches, chills, fatigue. Treatments tried: tylenol, mucinex. The treatment provided no relief.     Problems:  Patient Active Problem List   Diagnosis Date Noted   Colon cancer screening    Polyp of ascending colon    Healthcare maintenance 06/30/2019   Essential hypertension 09/23/2016   Bilateral carpal tunnel syndrome 09/23/2016   Cervical disc disease 09/23/2016   Epicondylitis, lateral, left 09/23/2016   Gastroesophageal reflux disease without esophagitis 09/23/2016   Large cell lymphoma, extranodal and solid organ sites [facility]) 01/28/2016    Allergies: No Known Allergies Medications:  Current Outpatient Medications:    molnupiravir EUA (LAGEVRIO) 200 mg CAPS capsule, Take 4 capsules (800 mg total) by mouth 2 (two) times daily  for 5 days., Disp: 40 capsule, Rfl: 0   acetaminophen (TYLENOL) 325 MG tablet, Take 325 mg by mouth every 6 (six) hours as needed., Disp: , Rfl:    amLODipine (NORVASC) 10 MG tablet, TAKE ONE (1) TABLET BY MOUTH ONCE DAILY, Disp: 90 tablet, Rfl: 0   gabapentin (NEURONTIN) 300 MG capsule, Take 300 mg by mouth at bedtime. For 60 doses (Patient not taking: Reported on 04/25/2021), Disp: , Rfl:    HYDROcodone-acetaminophen (NORCO/VICODIN) 5-325 MG tablet, Take 1  tablet by mouth every 8 (eight) hours as needed for moderate pain or severe pain. (Patient not taking: Reported on 04/08/2021), Disp: 15 tablet, Rfl: 0   meloxicam (MOBIC) 15 MG tablet, Take 15 mg by mouth daily., Disp: , Rfl:    omeprazole (PRILOSEC) 20 MG capsule, Take 1 capsule (20 mg total) by mouth daily. otc, Disp: 90 capsule, Rfl: 3   tiZANidine (ZANAFLEX) 4 MG tablet, Take 1 tablet (4 mg total) by mouth every 8 (eight) hours as needed for muscle spasms. (Patient not taking: Reported on 04/08/2021), Disp: 30 tablet, Rfl: 0   traMADol (ULTRAM) 50 MG tablet, Take 1 tablet (50 mg total) by mouth every 6 (six) hours as needed. (Patient not taking: Reported on 04/08/2021), Disp: 30 tablet, Rfl: 0  Observations/Objective: Patient is well-developed, well-nourished in no acute distress.  Resting comfortably at home.  Head is normocephalic, atraumatic.  No labored breathing.  Speech is clear and coherent with logical content.  Patient is alert and oriented at baseline.    Assessment and Plan: 1. COVID-19 - molnupiravir EUA (LAGEVRIO) 200 mg CAPS capsule; Take 4 capsules (800 mg total) by mouth 2 (two) times daily for 5 days.  Dispense: 40 capsule; Refill: 0  - Continue OTC symptomatic management of choice - Will send OTC vitamins and supplement information through AVS - Molnupiravir prescribed - Patient enrolled in MyChart symptom monitoring - Push fluids - Rest as needed - Discussed return precautions and when to seek in-person evaluation, sent via AVS as well  Follow Up Instructions: I discussed the assessment and treatment plan with the patient. The patient was provided an opportunity to ask questions and all were answered. The patient agreed with the plan and demonstrated an understanding of the instructions.  A copy of instructions were sent to the patient via MyChart unless otherwise noted below.    The patient was advised to call back or seek an in-person evaluation if the  symptoms worsen or if the condition fails to improve as anticipated.  Time:  I spent 10 minutes with the patient via telehealth technology discussing the above problems/concerns.    Mar Daring, PA-C

## 2021-06-24 NOTE — Patient Instructions (Signed)
Nathan Gill, thank you for joining Mar Daring, PA-C for today's virtual visit.  While this provider is not your primary care provider (PCP), if your PCP is located in our provider database this encounter information will be shared with them immediately following your visit.  Consent: (Patient) Nathan Gill provided verbal consent for this virtual visit at the beginning of the encounter.  Current Medications:  Current Outpatient Medications:    molnupiravir EUA (LAGEVRIO) 200 mg CAPS capsule, Take 4 capsules (800 mg total) by mouth 2 (two) times daily for 5 days., Disp: 40 capsule, Rfl: 0   acetaminophen (TYLENOL) 325 MG tablet, Take 325 mg by mouth every 6 (six) hours as needed., Disp: , Rfl:    amLODipine (NORVASC) 10 MG tablet, TAKE ONE (1) TABLET BY MOUTH ONCE DAILY, Disp: 90 tablet, Rfl: 0   gabapentin (NEURONTIN) 300 MG capsule, Take 300 mg by mouth at bedtime. For 60 doses (Patient not taking: Reported on 04/25/2021), Disp: , Rfl:    HYDROcodone-acetaminophen (NORCO/VICODIN) 5-325 MG tablet, Take 1 tablet by mouth every 8 (eight) hours as needed for moderate pain or severe pain. (Patient not taking: Reported on 04/08/2021), Disp: 15 tablet, Rfl: 0   meloxicam (MOBIC) 15 MG tablet, Take 15 mg by mouth daily., Disp: , Rfl:    omeprazole (PRILOSEC) 20 MG capsule, Take 1 capsule (20 mg total) by mouth daily. otc, Disp: 90 capsule, Rfl: 3   tiZANidine (ZANAFLEX) 4 MG tablet, Take 1 tablet (4 mg total) by mouth every 8 (eight) hours as needed for muscle spasms. (Patient not taking: Reported on 04/08/2021), Disp: 30 tablet, Rfl: 0   traMADol (ULTRAM) 50 MG tablet, Take 1 tablet (50 mg total) by mouth every 6 (six) hours as needed. (Patient not taking: Reported on 04/08/2021), Disp: 30 tablet, Rfl: 0   Medications ordered in this encounter:  Meds ordered this encounter  Medications   molnupiravir EUA (LAGEVRIO) 200 mg CAPS capsule    Sig: Take 4 capsules (800 mg total) by mouth 2 (two)  times daily for 5 days.    Dispense:  40 capsule    Refill:  0    Order Specific Question:   Supervising Provider    Answer:   Sabra Heck, [place]     *If you need refills on other medications prior to your next appointment, please contact your pharmacy*  Follow-Up: Call back or seek an in-person evaluation if the symptoms worsen or if the condition fails to improve as anticipated.  Other Instructions COVID-19: Quarantine and Isolation Quarantine If you were exposed Quarantine and stay away from others when you have been in close contact with someone who has COVID-19. Isolate If you are sick or test positive Isolate when you are sick or when you have COVID-19, even if you don't have symptoms. When to stay home Calculating quarantine The date of your exposure is considered day 0. Day 1 is the first full day after your last contact with a person who has had COVID-19. Stay home and away from other people for at least 5 days. Learn why CDC updated guidance for the general public. IF YOU were exposed to COVID-19 and are NOT  up to dateIF YOU were exposed to COVID-19 and are NOT on COVID-19 vaccinations Quarantine for at least 5 days Stay home Stay home and quarantine for at least 5 full days. Wear a well-fitting mask if you must be around others in your home. Do not travel. Get tested Even if  you don't develop symptoms, get tested at least 5 days after you last had close contact with someone with COVID-19. After quarantine Watch for symptoms Watch for symptoms until 10 days after you last had close contact with someone with COVID-19. Avoid travel It is best to avoid travel until a full 10 days after you last had close contact with someone with COVID-19. If you develop symptoms Isolate immediately and get tested. Continue to stay home until you know the results. Wear a well-fitting mask around others. Take precautions until day 10 Wear a well-fitting mask Wear a well-fitting mask  for 10 full days any time you are around others inside your home or in public. Do not go to places where you are unable to wear a well-fitting mask. If you must travel during days 6-10, take precautions. Avoid being around people who are more likely to get very sick from COVID-19. IF YOU were exposed to COVID-19 and are  up to dateIF YOU were exposed to COVID-19 and are on COVID-19 vaccinations No quarantine You do not need to stay home unless you develop symptoms. Get tested Even if you don't develop symptoms, get tested at least 5 days after you last had close contact with someone with COVID-19. Watch for symptoms Watch for symptoms until 10 days after you last had close contact with someone with COVID-19. If you develop symptoms Isolate immediately and get tested. Continue to stay home until you know the results. Wear a well-fitting mask around others. Take precautions until day 10 Wear a well-fitting mask Wear a well-fitting mask for 10 full days any time you are around others inside your home or in public. Do not go to places where you are unable to wear a well-fitting mask. Take precautions if traveling Avoid being around people who are more likely to get very sick from COVID-19. IF YOU were exposed to COVID-19 and had confirmed COVID-19 within the past 90 days (you tested positive using a viral test) No quarantine You do not need to stay home unless you develop symptoms. Watch for symptoms Watch for symptoms until 10 days after you last had close contact with someone with COVID-19. If you develop symptoms Isolate immediately and get tested. Continue to stay home until you know the results. Wear a well-fitting mask around others. Take precautions until day 10 Wear a well-fitting mask Wear a well-fitting mask for 10 full days any time you are around others inside your home or in public. Do not go to places where you are unable to wear a well-fitting mask. Take precautions if  traveling Avoid being around people who are more likely to get very sick from COVID-19. Calculating isolation Day 0 is your first day of symptoms or a positive viral test. Day 1 is the first full day after your symptoms developed or your test specimen was collected. If you have COVID-19 or have symptoms, isolate for at least 5 days. IF YOU tested positive for COVID-19 or have symptoms, regardless of vaccination status Stay home for at least 5 days Stay home for 5 days and isolate from others in your home. Wear a well-fitting mask if you must be around others in your home. Do not travel. Ending isolation if you had symptoms End isolation after 5 full days if you are fever-free for 24 hours (without the use of fever-reducing medication) and your symptoms are improving. Ending isolation if you did NOT have symptoms End isolation after at least 5 full days after your positive  test. If you got very sick from COVID-19 or have a weakened immune system You should isolate for at least 10 days. Consult your doctor before ending isolation. Take precautions until day 10 Wear a well-fitting mask Wear a well-fitting mask for 10 full days any time you are around others inside your home or in public. Do not go to places where you are unable to wear a well-fitting mask. Do not travel Do not travel until a full 10 days after your symptoms started or the date your positive test was taken if you had no symptoms. Avoid being around people who are more likely to get very sick from COVID-19. Definitions Exposure Contact with someone infected with SARS-CoV-2, the virus that causes COVID-19, in a way that increases the likelihood of getting infected with the virus. Close contact A close contact is someone who was less than 6 feet away from an infected person (laboratory-confirmed or a clinical diagnosis) for a cumulative total of 15 minutes or more over a 24-hour period. For example, three individual 5-minute  exposures for a total of 15 minutes. People who are exposed to someone with COVID-19 after they completed at least 5 days of isolation are not considered close contacts. Julio Sicks is a strategy used to prevent transmission of COVID-19 by keeping people who have been in close contact with someone with COVID-19 apart from others. Who does not need to quarantine? If you had close contact with someone with COVID-19 and you are in one of the following groups, you do not need to quarantine. You are up to date with your COVID-19 vaccines. You had confirmed COVID-19 within the last 90 days (meaning you tested positive using a viral test). If you are up to date with COVID-19 vaccines, you should wear a well-fitting mask around others for 10 days from the date of your last close contact with someone with COVID-19 (the date of last close contact is considered day 0). Get tested at least 5 days after you last had close contact with someone with COVID-19. If you test positive or develop COVID-19 symptoms, isolate from other people and follow recommendations in the Isolation section below. If you tested positive for COVID-19 with a viral test within the previous 90 days and subsequently recovered and remain without COVID-19 symptoms, you do not need to quarantine or get tested after close contact. You should wear a well-fitting mask around others for 10 days from the date of your last close contact with someone with COVID-19 (the date of last close contact is considered day 0). If you have COVID-19 symptoms, get tested and isolate from other people and follow recommendations in the Isolation section below. Who should quarantine? If you come into close contact with someone with COVID-19, you should quarantine if you are not up to date on COVID-19 vaccines. This includes people who are not vaccinated. What to do for quarantine Stay home and away from other people for at least 5 days (day 0 through day 5)  after your last contact with a person who has COVID-19. The date of your exposure is considered day 0. Wear a well-fitting mask when around others at home, if possible. For 10 days after your last close contact with someone with COVID-19, watch for fever (100.40F or greater), cough, shortness of breath, or other COVID-19 symptoms. If you develop symptoms, get tested immediately and isolate until you receive your test results. If you test positive, follow isolation recommendations. If you do not develop symptoms, get  tested at least 5 days after you last had close contact with someone with COVID-19. If you test negative, you can leave your home, but continue to wear a well-fitting mask when around others at home and in public until 10 days after your last close contact with someone with COVID-19. If you test positive, you should isolate for at least 5 days from the date of your positive test (if you do not have symptoms). If you do develop COVID-19 symptoms, isolate for at least 5 days from the date your symptoms began (the date the symptoms started is day 0). Follow recommendations in the isolation section below. If you are unable to get a test 5 days after last close contact with someone with COVID-19, you can leave your home after day 5 if you have been without COVID-19 symptoms throughout the 5-day period. Wear a well-fitting mask for 10 days after your date of last close contact when around others at home and in public. Avoid people who are have weakened immune systems or are more likely to get very sick from COVID-19, and nursing homes and other high-risk settings, until after at least 10 days. If possible, stay away from people you live with, especially people who are at higher risk for getting very sick from COVID-19, as well as others outside your home throughout the full 10 days after your last close contact with someone with COVID-19. If you are unable to quarantine, you should wear a well-fitting  mask for 10 days when around others at home and in public. If you are unable to wear a mask when around others, you should continue to quarantine for 10 days. Avoid people who have weakened immune systems or are more likely to get very sick from COVID-19, and nursing homes and other high-risk settings, until after at least 10 days. See additional information about travel. Do not go to places where you are unable to wear a mask, such as restaurants and some gyms, and avoid eating around others at home and at work until after 10 days after your last close contact with someone with COVID-19. After quarantine Watch for symptoms until 10 days after your last close contact with someone with COVID-19. If you have symptoms, isolate immediately and get tested. Quarantine in high-risk congregate settings In certain congregate settings that have high risk of secondary transmission (such as Systems analyst and detention facilities, homeless shelters, or cruise ships), CDC recommends a 10-day quarantine for residents, regardless of vaccination and booster status. During periods of critical staffing shortages, facilities may consider shortening the quarantine period for staff to ensure continuity of operations. Decisions to shorten quarantine in these settings should be made in consultation with state, local, tribal, or territorial health departments and should take into consideration the context and characteristics of the facility. CDC's setting-specific guidance provides additional recommendations for these settings. Isolation Isolation is used to separate people with confirmed or suspected COVID-19 from those without COVID-19. People who are in isolation should stay home until it's safe for them to be around others. At home, anyone sick or infected should separate from others, or wear a well-fitting mask when they need to be around others. People in isolation should stay in a specific "sick room" or area and use a  separate bathroom if available. Everyone who has presumed or confirmed COVID-19 should stay home and isolate from other people for at least 5 full days (day 0 is the first day of symptoms or the date of the day of  the positive viral test for asymptomatic persons). They should wear a mask when around others at home and in public for an additional 5 days. People who are confirmed to have COVID-19 or are showing symptoms of COVID-19 need to isolate regardless of their vaccination status. This includes: People who have a positive viral test for COVID-19, regardless of whether or not they have symptoms. People with symptoms of COVID-19, including people who are awaiting test results or have not been tested. People with symptoms should isolate even if they do not know if they have been in close contact with someone with COVID-19. What to do for isolation Monitor your symptoms. If you have an emergency warning sign (including trouble breathing), seek emergency medical care immediately. Stay in a separate room from other household members, if possible. Use a separate bathroom, if possible. Take steps to improve ventilation at home, if possible. Avoid contact with other members of the household and pets. Don't share personal household items, like cups, towels, and utensils. Wear a well-fitting mask when you need to be around other people. Learn more about what to do if you are sick and how to notify your contacts. Ending isolation for people who had COVID-19 and had symptoms If you had COVID-19 and had symptoms, isolate for at least 5 days. To calculate your 5-day isolation period, day 0 is your first day of symptoms. Day 1 is the first full day after your symptoms developed. You can leave isolation after 5 full days. You can end isolation after 5 full days if you are fever-free for 24 hours without the use of fever-reducing medication and your other symptoms have improved (Loss of taste and smell may persist  for weeks or months after recovery and need not delay the end of isolation). You should continue to wear a well-fitting mask around others at home and in public for 5 additional days (day 6 through day 10) after the end of your 5-day isolation period. If you are unable to wear a mask when around others, you should continue to isolate for a full 10 days. Avoid people who have weakened immune systems or are more likely to get very sick from COVID-19, and nursing homes and other high-risk settings, until after at least 10 days. If you continue to have fever or your other symptoms have not improved after 5 days of isolation, you should wait to end your isolation until you are fever-free for 24 hours without the use of fever-reducing medication and your other symptoms have improved. Continue to wear a well-fitting mask through day 10. Contact your healthcare provider if you have questions. See additional information about travel. Do not go to places where you are unable to wear a mask, such as restaurants and some gyms, and avoid eating around others at home and at work until a full 10 days after your first day of symptoms. If an individual has access to a test and wants to test, the best approach is to use an antigen test1 towards the end of the 5-day isolation period. Collect the test sample only if you are fever-free for 24 hours without the use of fever-reducing medication and your other symptoms have improved (loss of taste and smell may persist for weeks or months after recovery and need not delay the end of isolation). If your test result is positive, you should continue to isolate until day 10. If your test result is negative, you can end isolation, but continue to wear a well-fitting mask  around others at home and in public until day 10. Follow additional recommendations for masking and avoiding travel as described above. 1As noted in the labeling for authorized over-the counter antigen tests: Negative  results should be treated as presumptive. Negative results do not rule out SARS-CoV-2 infection and should not be used as the sole basis for treatment or patient management decisions, including infection control decisions. To improve results, antigen tests should be used twice over a three-day period with at least 24 hours and no more than 48 hours between tests. Note that these recommendations on ending isolation do not apply to people who are moderately ill or very sick from COVID-19 or have weakened immune systems. See section below for recommendations for when to end isolation for these groups. Ending isolation for people who tested positive for COVID-19 but had no symptoms If you test positive for COVID-19 and never develop symptoms, isolate for at least 5 days. Day 0 is the day of your positive viral test (based on the date you were tested) and day 1 is the first full day after the specimen was collected for your positive test. You can leave isolation after 5 full days. If you continue to have no symptoms, you can end isolation after at least 5 days. You should continue to wear a well-fitting mask around others at home and in public until day 10 (day 6 through day 10). If you are unable to wear a mask when around others, you should continue to isolate for 10 days. Avoid people who have weakened immune systems or are more likely to get very sick from COVID-19, and nursing homes and other high-risk settings, until after at least 10 days. If you develop symptoms after testing positive, your 5-day isolation period should start over. Day 0 is your first day of symptoms. Follow the recommendations above for ending isolation for people who had COVID-19 and had symptoms. See additional information about travel. Do not go to places where you are unable to wear a mask, such as restaurants and some gyms, and avoid eating around others at home and at work until 10 days after the day of your positive test. If an  individual has access to a test and wants to test, the best approach is to use an antigen test1 towards the end of the 5-day isolation period. If your test result is positive, you should continue to isolate until day 10. If your test result is positive, you can also choose to test daily and if your test result is negative, you can end isolation, but continue to wear a well-fitting mask around others at home and in public until day 10. Follow additional recommendations for masking and avoiding travel as described above. 1As noted in the labeling for authorized over-the counter antigen tests: Negative results should be treated as presumptive. Negative results do not rule out SARS-CoV-2 infection and should not be used as the sole basis for treatment or patient management decisions, including infection control decisions. To improve results, antigen tests should be used twice over a three-day period with at least 24 hours and no more than 48 hours between tests. Ending isolation for people who were moderately or very sick from COVID-19 or have a weakened immune system People who are moderately ill from COVID-19 (experiencing symptoms that affect the lungs like shortness of breath or difficulty breathing) should isolate for 10 days and follow all other isolation precautions. To calculate your 10-day isolation period, day 0 is your first day  of symptoms. Day 1 is the first full day after your symptoms developed. If you are unsure if your symptoms are moderate, talk to a healthcare provider for further guidance. People who are very sick from COVID-19 (this means people who were hospitalized or required intensive care or ventilation support) and people who have weakened immune systems might need to isolate at home longer. They may also require testing with a viral test to determine when they can be around others. CDC recommends an isolation period of at least 10 and up to 20 days for people who were very sick from  COVID-19 and for people with weakened immune systems. Consult with your healthcare provider about when you can resume being around other people. If you are unsure if your symptoms are severe or if you have a weakened immune system, talk to a healthcare provider for further guidance. People who have a weakened immune system should talk to their healthcare provider about the potential for reduced immune responses to COVID-19 vaccines and the need to continue to follow current prevention measures (including wearing a well-fitting mask and avoiding crowds and poorly ventilated indoor spaces) to protect themselves against COVID-19 until advised otherwise by their healthcare provider. Close contacts of immunocompromised people--including household members--should also be encouraged to receive all recommended COVID-19 vaccine doses to help protect these people. Isolation in high-risk congregate settings In certain high-risk congregate settings that have high risk of secondary transmission and where it is not feasible to cohort people (such as Systems analyst and detention facilities, homeless shelters, and cruise ships), CDC recommends a 10-day isolation period for residents. During periods of critical staffing shortages, facilities may consider shortening the isolation period for staff to ensure continuity of operations. Decisions to shorten isolation in these settings should be made in consultation with state, local, tribal, or territorial health departments and should take into consideration the context and characteristics of the facility. CDC's setting-specific guidance provides additional recommendations for these settings. This CDC guidance is meant to supplement--not replace--any federal, state, local, territorial, or tribal health and safety laws, rules, and regulations. Recommendations for specific settings These recommendations do not apply to healthcare professionals. For guidance specific to these settings,  see Healthcare professionals: Interim Guidance for Optician, dispensing with SARS-CoV-2 Infection or Exposure to SARS-CoV-2 Patients, residents, and visitors to healthcare settings: Interim Infection Prevention and Control Recommendations for Healthcare Personnel During the [place] 2019 (COVID-19) Pandemic Additional setting-specific guidance and recommendations are available. These recommendations on quarantine and isolation do apply to [place] settings. Additional guidance is available here: Overview of COVID-19 Quarantine for K-12 Schools Travelers: Travel information and recommendations Congregate facilities and other settings: [employer] for community, work, and school settings Ongoing COVID-19 exposure FAQs I live with someone with COVID-19, but I cannot be separated from them. How do we manage quarantine in this situation? It is very important for people with COVID-19 to remain apart from other people, if possible, even if they are living together. If separation of the person with COVID-19 from others that they live with is not possible, the other people that they live with will have ongoing exposure, meaning they will be repeatedly exposed until that person is no longer able to spread the virus to other people. In this situation, there are precautions you can take to limit the spread of COVID-19: The person with COVID-19 and everyone they live with should wear a well-fitting mask inside the home. If possible, one person should care for the person with  COVID-19 to limit the number of people who are in close contact with the infected person. Take steps to protect yourself and others to reduce transmission in the home: Quarantine if you are not up to date with your COVID-19 vaccines. Isolate if you are sick or tested positive for COVID-19, even if you don't have symptoms. Learn more about the public health recommendations for testing, mask use and quarantine of close  contacts, like yourself, who have ongoing exposure. These recommendations differ depending on your vaccination status. What should I do if I have ongoing exposure to COVID-19 from someone I live with? Recommendations for this situation depend on your vaccination status: If you are not up to date on COVID-19 vaccines and have ongoing exposure to COVID-19, you should: Begin quarantine immediately and continue to quarantine throughout the isolation period of the person with COVID-19. Continue to quarantine for an additional 5 days starting the day after the end of isolation for the person with COVID-19. Get tested at least 5 days after the end of isolation of the infected person that lives with them. If you test negative, you can leave the home but should continue to wear a well-fitting mask when around others at home and in public until 10 days after the end of isolation for the person with COVID-19. Isolate immediately if you develop symptoms of COVID-19 or test positive. If you are up to date with COVID-19 vaccines and have ongoing exposure to COVID-19, you should: Get tested at least 5 days after your first exposure. A person with COVID-19 is considered infectious starting 2 days before they develop symptoms, or 2 days before the date of their positive test if they do not have symptoms. Get tested again at least 5 days after the end of isolation for the person with COVID-19. Wear a well-fitting mask when you are around the person with COVID-19, and do this throughout their isolation period. Wear a well-fitting mask around others for 10 days after the infected person's isolation period ends. Isolate immediately if you develop symptoms of COVID-19 or test positive. What should I do if multiple people I live with test positive for COVID-19 at different times? Recommendations for this situation depend on your vaccination status: If you are not up to date with your COVID-19 vaccines, you  should: Quarantine throughout the isolation period of any infected person that you live with. Continue to quarantine until 5 days after the end of isolation date for the most recently infected person that lives with you. For example, if the last day of isolation of the person most recently infected with COVID-19 was Gill 30, the new 5-day quarantine period starts on July 1. Get tested at least 5 days after the end of isolation for the most recently infected person that lives with you. Wear a well-fitting mask when you are around any person with COVID-19 while that person is in isolation. Wear a well-fitting mask when you are around other people until 10 days after your last close contact. Isolate immediately if you develop symptoms of COVID-19 or test positive. If you are up to date with your COVID-19 vaccines, you should: Get tested at least 5 days after your first exposure. A person with COVID-19 is considered infectious starting 2 days before they developed symptoms, or 2 days before the date of their positive test if they do not have symptoms. Get tested again at least 5 days after the end of isolation for the most recently infected person that lives with  you. Wear a well-fitting mask when you are around any person with COVID-19 while that person is in isolation. Wear a well-fitting mask around others for 10 days after the end of isolation for the most recently infected person that lives with you. For example, if the last day of isolation for the person most recently infected with COVID-19 was Gill 30, the new 10-day period to wear a well-fitting mask indoors in public starts on July 1. Isolate immediately if you develop symptoms of COVID-19 or test positive. I had COVID-19 and completed isolation. Do I have to quarantine or get tested if someone I live with gets COVID-19 shortly after I completed isolation? No. If you recently completed isolation and someone that lives with you tests positive for  the virus that causes COVID-19 shortly after the end of your isolation period, you do not have to quarantine or get tested as long as you do not develop new symptoms. Once all of the people that live together have completed isolation or quarantine, refer to the guidance below for new exposures to COVID-19. If you had COVID-19 in the previous 90 days and then came into close contact with someone with COVID-19, you do not have to quarantine or get tested if you do not have symptoms. But you should: Wear a well-fitting mask indoors in public for 10 days after your last close contact. Monitor for COVID-19 symptoms for 10 days from the date of your last close contact. Isolate immediately and get tested if symptoms develop. If more than 90 days have passed since your recovery from infection, follow CDC's recommendations for close contacts. These recommendations will differ depending on your vaccination status. 09/18/2020 Content source: [facility] for Immunization and Respiratory Diseases ([facility]), Division of Viral Diseases This information is not intended to replace advice given to you by your health care provider. Make sure you discuss any questions you have with your health care provider. Document Revised: 01/22/2021 Document Reviewed: 01/22/2021 Elsevier Patient Education  2022 [place] Oral Capsules What is this medication? MOLNUPIRAVIR (mol nue pir a vir) treats COVID-19. It is an antiviral medication. It may decrease the risk of developing severe symptoms of COVID-19. It may also decrease the chance of going to the hospital. This medication is not approved by the FDA. The FDA has authorized emergency use of this medication during the COVID-19 pandemic. This medicine may be used for other purposes; ask your health care provider or pharmacist if you have questions. COMMON BRAND NAME(S): LAGEVRIO What should I tell my care team before I take this medication? They need to  know if you have any of these conditions: Any allergies Any serious illness An unusual or allergic reaction to molnupiravir, other medications, foods, dyes, or preservatives Pregnant or trying to get pregnant Breast-feeding How should I use this medication? Take this medication by mouth with water. Take it as directed on the prescription label at the same time every day. Do not cut, crush or chew this medication. Swallow the capsules whole. You can take it with or without food. If it upsets your stomach, take it with food. Take all of this medication unless your care team tells you to stop it early. Keep taking it even if you think you are better. Talk to your care team about the use of this medication in children. Special care may be needed. Overdosage: If you think you have taken too much of this medicine contact a poison control center or emergency room  at once. NOTE: This medicine is only for you. Do not share this medicine with others. What if I miss a dose? If you miss a dose, take it as soon as you can unless it is more than 10 hours late. If it is more than 10 hours late, skip the missed dose. Take the next dose at the normal time. Do not take extra or 2 doses at the same time to make up for the missed dose. What may interact with this medication? Interactions have not been studied. This list may not describe all possible interactions. Give your health care provider a list of all the medicines, herbs, non-prescription drugs, or dietary supplements you use. Also tell them if you smoke, drink alcohol, or use illegal drugs. Some items may interact with your medicine. What should I watch for while using this medication? Your condition will be monitored carefully while you are receiving this medication. Visit your care team for regular checkups. Tell your care team if your symptoms do not start to get better or if they get worse. Do not become pregnant while taking this medication. You may need  a pregnancy test before starting this medication. Women must use a reliable form of birth control while taking this medication and for 4 days after stopping the medication. Women should inform their care team if they wish to become pregnant or think they might be pregnant. Men should not father a child while taking this medication and for 3 months after stopping it. There is potential for serious harm to an unborn child. Talk to your care team for more information. Do not breast-feed an infant while taking this medication and for 4 days after stopping the medication. What side effects may I notice from receiving this medication? Side effects that you should report to your care team as soon as possible: Allergic reactions--skin rash, itching, hives, swelling of the face, lips, tongue, or throat Side effects that usually do not require medical attention (report these to your care team if they continue or are bothersome): Diarrhea Dizziness Nausea This list may not describe all possible side effects. Call your doctor for medical advice about side effects. You may report side effects to FDA at 1-800-FDA-1088. Where should I keep my medication? Keep out of the reach of children and pets. Store at room temperature between 20 and 25 degrees C (68 and 77 degrees F). Get rid of any unused medication after the expiration date. To get rid of medications that are no longer needed or have expired: Take the medication to a medication take-back program. Check with your pharmacy or law enforcement to find a location. If you cannot return the medication, check the label or package insert to see if the medication should be thrown out in the garbage or flushed down the toilet. If you are not sure, ask your care team. If it is safe to put it in the trash, take the medication out of the container. Mix the medication with cat litter, dirt, coffee grounds, or other unwanted substance. Seal the mixture in a bag or container.  Put it in the trash. NOTE: This sheet is a summary. It may not cover all possible information. If you have questions about this medicine, talk to your doctor, pharmacist, or health care provider.  2022 Elsevier/Gold Standard (2020-06-17 00:00:00)    If you have been instructed to have an in-person evaluation today at a local Urgent Care facility, please use the link below. It will take you to  a list of all of our available [facility], including address, phone number and hours of operation. Please do not delay care.  [facility]  If you or a family member do not have a primary care provider, use the link below to schedule a visit and establish care. When you choose a [facility] primary care physician or advanced practice provider, you gain a long-term partner in health. Find a Primary Care Provider  Learn more about [facility]'s in-office and virtual care options: [facility]

## 2021-06-24 NOTE — Telephone Encounter (Signed)
Chief Complaint: COVID positive Symptoms: Nasal congestion, dry cough, body aches, fatigue, weakness, headache Frequency: Symptoms started on Saturday afternoon  Pertinent Negatives: Patient denies SOB, fever, other symptoms Disposition: [] ED /[] Urgent Care (no appt availability in office) / [] Appointment(In office/virtual)/ [x]  [facility]/ [] Home Care/ [] Refused Recommended Disposition /[] [facility]/ []  Follow-up with PCP Additional Notes: Virtual UC visit today at 1430, no openings in the office today    Summary: positive Covid   Pt called and stated that he tested positive for covid and would like to know next steps.      Reason for Disposition  [1] HIGH RISK for severe COVID complications (e.g., weak immune system, age > 81 years, obesity with BMI > 25, pregnant, chronic lung disease or other chronic medical condition) AND [2] COVID symptoms (e.g., cough, fever)  (Exceptions: Already seen by PCP and no new or worsening symptoms.)  Answer Assessment - Initial Assessment Questions 1. COVID-19 DIAGNOSIS: "Who made your COVID-19 diagnosis?" "Was it confirmed by a positive lab test or self-test?" If not diagnosed by a doctor (or NP/PA), ask "Are there lots of cases (community spread) where you live?" Note: See public health department website, if unsure.     Home test on Sunday 2. COVID-19 EXPOSURE: "Was there any known exposure to COVID before the symptoms began?" CDC Definition of close contact: within 6 feet (2 meters) for a total of 15 minutes or more over a 24-hour period.      Unknown, at a funeral on Thursday  3. ONSET: "When did the COVID-19 symptoms start?"      Saturday afternoon thick mucus drainage 4. WORST SYMPTOM: "What is your worst symptom?" (e.g., cough, fever, shortness of breath, muscle aches)     Sinus congestion 5. COUGH: "Do you have a cough?" If Yes, ask: "How bad is the cough?"       Yes, dry 6. FEVER: "Do you have a fever?" If Yes, ask:  "What is your temperature, how was it measured, and when did it start?"     No, taking Tylenol 7. RESPIRATORY STATUS: "Describe your breathing?" (e.g., shortness of breath, wheezing, unable to speak)      No 8. BETTER-SAME-WORSE: "Are you getting better, staying the same or getting worse compared to yesterday?"  If getting worse, ask, "In what way?"     Better 9. HIGH RISK DISEASE: "Do you have any chronic medical problems?" (e.g., asthma, heart or lung disease, weak immune system, obesity, etc.)     N/a 10. VACCINE: "Have you had the COVID-19 vaccine?" If Yes, ask: "Which one, how many shots, when did you get it?"       N/a 11. BOOSTER: "Have you received your COVID-19 booster?" If Yes, ask: "Which one and when did you get it?"       N/A 12. PREGNANCY: "Is there any chance you are pregnant?" "When was your last menstrual period?"       N/A 13. OTHER SYMPTOMS: "Do you have any other symptoms?"  (e.g., chills, fatigue, headache, loss of smell or taste, muscle pain, sore throat)       Headache, sinus congestion, fatigue, weak, cough 14. O2 SATURATION MONITOR:  "Do you use an oxygen saturation monitor (pulse oximeter) at home?" If Yes, ask "What is your reading (oxygen level) today?" "What is your usual oxygen saturation reading?" (e.g., 95%)       N/A  Protocols used: Coronavirus (COVID-19) Diagnosed or Suspected-A-AH

## 2021-06-24 NOTE — Telephone Encounter (Signed)
Copied from [facility] [id number]. Topic: Quick Communication - Rx Refill/Question >> Jun 24, 2021  3:27 PM Yvette Rack wrote: Pt asked that the Rx be transferred to [facility], [place] [address] because [facility] does not have the medication.  Medication: molnupiravir EUA (LAGEVRIO) 200 mg CAPS capsule  Has the patient contacted their pharmacy? Yes.   (Agent: If no, request that the patient contact the pharmacy for the refill. If patient does not wish to contact the pharmacy document the reason why and proceed with request.) (Agent: If yes, when and what did the pharmacy advise?)  Preferred Pharmacy (with phone number or street name): [id number] - [place], [place] [address] AT [place]  Phone:  [phone] Fax:  [phone]  Has the patient been seen for an appointment in the last year OR does the patient have an upcoming appointment? Yes.    Agent: Please be advised that RX refills may take up to 3 business days. We ask that you follow-up with your pharmacy.

## 2021-06-24 NOTE — Telephone Encounter (Signed)
Noted  KP 

## 2021-06-25 ENCOUNTER — Other Ambulatory Visit: Payer: Self-pay | Admitting: *Deleted

## 2021-06-25 DIAGNOSIS — C8589 Other specified types of non-Hodgkin lymphoma, extranodal and solid organ sites: Secondary | ICD-10-CM

## 2021-06-30 ENCOUNTER — Ambulatory Visit: Payer: Medicare Other | Admitting: Nurse Practitioner

## 2021-06-30 ENCOUNTER — Ambulatory Visit: Payer: BLUE CROSS/BLUE SHIELD | Admitting: Hematology and Oncology

## 2021-06-30 ENCOUNTER — Other Ambulatory Visit: Payer: BLUE CROSS/BLUE SHIELD

## 2021-06-30 ENCOUNTER — Other Ambulatory Visit: Payer: Medicare Other

## 2021-07-14 ENCOUNTER — Inpatient Hospital Stay: Payer: Medicare Other | Admitting: Nurse Practitioner

## 2021-07-14 ENCOUNTER — Inpatient Hospital Stay: Payer: Medicare Other | Attending: Nurse Practitioner

## 2021-07-25 ENCOUNTER — Telehealth: Payer: Self-pay | Admitting: Nurse Practitioner

## 2021-07-25 NOTE — Telephone Encounter (Signed)
Pt called to reschedule his appt from 1-28. Call back at [phone]

## 2021-08-01 ENCOUNTER — Other Ambulatory Visit: Payer: Self-pay

## 2021-08-01 ENCOUNTER — Encounter (INDEPENDENT_AMBULATORY_CARE_PROVIDER_SITE_OTHER): Payer: Self-pay

## 2021-08-01 ENCOUNTER — Inpatient Hospital Stay: Payer: Medicare Other

## 2021-08-01 ENCOUNTER — Inpatient Hospital Stay: Payer: Medicare Other | Attending: Nurse Practitioner | Admitting: Nurse Practitioner

## 2021-08-01 VITALS — BP 151/91 | HR 75 | Temp 97.8°F | Resp 16 | Wt 245.0 lb

## 2021-08-01 DIAGNOSIS — Z9079 Acquired absence of other genital organ(s): Secondary | ICD-10-CM | POA: Insufficient documentation

## 2021-08-01 DIAGNOSIS — Z08 Encounter for follow-up examination after completed treatment for malignant neoplasm: Secondary | ICD-10-CM

## 2021-08-01 DIAGNOSIS — Z8572 Personal history of non-Hodgkin lymphomas: Secondary | ICD-10-CM | POA: Diagnosis not present

## 2021-08-01 DIAGNOSIS — C8589 Other specified types of non-Hodgkin lymphoma, extranodal and solid organ sites: Secondary | ICD-10-CM

## 2021-08-01 DIAGNOSIS — Z8579 Personal history of other malignant neoplasms of lymphoid, hematopoietic and related tissues: Secondary | ICD-10-CM | POA: Diagnosis not present

## 2021-08-01 DIAGNOSIS — Z923 Personal history of irradiation: Secondary | ICD-10-CM | POA: Diagnosis not present

## 2021-08-01 LAB — CBC WITH DIFFERENTIAL/PLATELET
Abs Immature Granulocytes: 0.04 10*3/uL (ref 0.00–0.07)
Basophils Absolute: 0 10*3/uL (ref 0.0–0.1)
Basophils Relative: 1 %
Eosinophils Absolute: 0.1 10*3/uL (ref 0.0–0.5)
Eosinophils Relative: 2 %
HCT: 43.1 % (ref 39.0–52.0)
Hemoglobin: 14.9 g/dL (ref 13.0–17.0)
Immature Granulocytes: 1 %
Lymphocytes Relative: 20 %
Lymphs Abs: 1.1 10*3/uL (ref 0.7–4.0)
MCH: 28.2 pg (ref 26.0–34.0)
MCHC: 34.6 g/dL (ref 30.0–36.0)
MCV: 81.6 fL (ref 80.0–100.0)
Monocytes Absolute: 0.4 10*3/uL (ref 0.1–1.0)
Monocytes Relative: 7 %
Neutro Abs: 3.8 10*3/uL (ref 1.7–7.7)
Neutrophils Relative %: 69 %
Platelets: 200 10*3/uL (ref 150–400)
RBC: 5.28 MIL/uL (ref 4.22–5.81)
RDW: 13.3 % (ref 11.5–15.5)
WBC: 5.4 10*3/uL (ref 4.0–10.5)
nRBC: 0 % (ref 0.0–0.2)

## 2021-08-01 LAB — COMPREHENSIVE METABOLIC PANEL
ALT: 24 U/L (ref 0–44)
AST: 22 U/L (ref 15–41)
Albumin: 4.4 g/dL (ref 3.5–5.0)
Alkaline Phosphatase: 73 U/L (ref 38–126)
Anion gap: 8 (ref 5–15)
BUN: 26 mg/dL — ABNORMAL HIGH (ref 8–23)
CO2: 25 mmol/L (ref 22–32)
Calcium: 9.1 mg/dL (ref 8.9–10.3)
Chloride: 103 mmol/L (ref 98–111)
Creatinine, Ser: 0.69 mg/dL (ref 0.61–1.24)
GFR, Estimated: >60 mL/min (ref >60)
Glucose, Bld: 155 mg/dL — ABNORMAL HIGH (ref 70–99)
Potassium: 4.2 mmol/L (ref 3.5–5.1)
Sodium: 136 mmol/L (ref 135–145)
Total Bilirubin: 0.6 mg/dL (ref 0.3–1.2)
Total Protein: 7.5 g/dL (ref 6.5–8.1)

## 2021-08-01 LAB — LACTATE DEHYDROGENASE: LDH: 147 U/L (ref 98–192)

## 2021-08-01 LAB — URIC ACID: Uric Acid, Serum: 6.7 mg/dL (ref 3.7–8.6)

## 2021-08-01 NOTE — Progress Notes (Signed)
[place] at [facility], [place] Phone: [phone]    Clinic Day:  08/01/2021  Referring physician: Juline Patch, MD  Chief Complaint: Nathan Gill is a 66 y.o. male with stage IE diffuse large cell lymphoma who is seen for 1 year assessment.  HPI:    Nathan Gill is a 66 y.o. 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  Pathology revealed diffuse large cell lymphoma.  There was a monoclonal population of large B cells expressing kappa light chains, CD19, CD20, CD10, and CD5.  Bone marrow was negative.  MUGA on 07/24/2011 revealed an EF of 57.2%.  PET scan on 07/30/2011 revealed no hypermetabolic lymph nodes identified in the neck, chest, abdomen, or pelvis. There was mild metabolic activity in the left groin and origin of the inguinal canal likely related to recent prior orchiectomy.  LP on 08/10/2011 revealed no malignant cells.    He received RCHOP x 6 (08/13/2011 - 12/29/2011).  He received involved field radiation of 3000 cGy over 3 weeks to his scrotum.  He received CNS prophylaxis.  He received 1 cycle of high dose IV MTX complicated by acute renal failure.  He received intrathecal MTX x 4 during cycles #3-6  (10/20/2011-12/28/2011).   PET scan on 06/23/2012 was normal.  Chest, abdomen and pelvis CT on 01/29/2015 was stable with no evidence for residual or recurrent adenopathy.  Abdomen and pelvis CT on 01/27/2016 revealed no evidence of recurrent lymphoma.  Colonoscopy 04/25/21- 2 polyps removed. Recommendation to repeat in November 2029.   He received the Moderna COVID-19 vaccine on 08/29/2019 and 09/26/2019. He received the [employer] on 04/21/2020.   Interval History: Patient is 66 year old male who returns to clinic for follow up and continued surveillance of history of DLBCL. He continues to feel well. Denies fevers, chills, new lumps or bumps. No night sweats or unintentional  weight loss. He continues to work. Denies complaints. In the interim he has had a colonoscopy.    Past Medical History:  Diagnosis Date   Arthritis    Elbows, wrists, knees   Cancer (HCC)    GERD (gastroesophageal reflux disease)    Hypertension    Lymphoma ([place]) 06/23/2011   chemo/radiation treatment   Wears hearing aid in left ear     Past Surgical History:  Procedure Laterality Date   COLONOSCOPY WITH PROPOFOL N/A 04/25/2021   Procedure: COLONOSCOPY WITH BIOPSY;  Surgeon: Lucilla Lame, MD;  Location: [place];  Service: Endoscopy;  Laterality: N/A;   EXTERNAL EAR SURGERY     KNEE ARTHROSCOPY     POLYPECTOMY N/A 04/25/2021   Procedure: POLYPECTOMY;  Surgeon: Lucilla Lame, MD;  Location: [place];  Service: Endoscopy;  Laterality: N/A;   TESTICLE SURGERY      Family History  Problem Relation Age of Onset   Lung cancer Father    Leukemia Maternal Uncle    Melanoma Paternal Grandfather    Colon cancer Cousin     Social History:  reports that he has never smoked. He has never used smokeless tobacco. He reports current alcohol use. He reports that he does not use drugs.  He may drink a beer every 2 weeks with pizza.  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  He lives in [place].   Allergies: No Known Allergies  Current Medications: Current Outpatient Medications  Medication Sig Dispense Refill   acetaminophen (TYLENOL) 325 MG tablet Take 325 mg by mouth every 6 (six) hours as needed.     amLODipine (NORVASC) 10 MG tablet TAKE ONE (1) TABLET BY MOUTH ONCE DAILY 90 tablet 0   meloxicam (MOBIC) 15 MG tablet Take 15 mg by mouth daily.     omeprazole (PRILOSEC) 20 MG capsule Take 1 capsule (20 mg total) by mouth daily. otc 90 capsule 3   gabapentin (NEURONTIN) 300 MG capsule Take 300 mg by mouth at bedtime. For 60 doses (Patient not taking: Reported on 04/25/2021)      HYDROcodone-acetaminophen (NORCO/VICODIN) 5-325 MG tablet Take 1 tablet by mouth every 8 (eight) hours as needed for moderate pain or severe pain. (Patient not taking: Reported on 04/08/2021) 15 tablet 0   tiZANidine (ZANAFLEX) 4 MG tablet Take 1 tablet (4 mg total) by mouth every 8 (eight) hours as needed for muscle spasms. (Patient not taking: Reported on 04/08/2021) 30 tablet 0   traMADol (ULTRAM) 50 MG tablet Take 1 tablet (50 mg total) by mouth every 6 (six) hours as needed. (Patient not taking: Reported on 04/08/2021) 30 tablet 0   No current facility-administered medications for this visit.    Review of Systems  Constitutional:  Negative for chills, fever, malaise/fatigue and weight loss.  HENT:  Negative for hearing loss, nosebleeds, sore throat and tinnitus.   Eyes:  Negative for blurred vision and double vision.  Respiratory:  Negative for cough, hemoptysis, shortness of breath and wheezing.   Cardiovascular:  Negative for chest pain, palpitations and leg swelling.  Gastrointestinal:  Negative for abdominal pain, blood in stool, constipation, diarrhea, melena, nausea and vomiting.  Genitourinary:  Negative for dysuria and urgency.  Musculoskeletal:  Negative for back pain, falls, joint pain and myalgias.  Skin:  Negative for itching and rash.  Neurological:  Negative for dizziness, tingling, sensory change, loss of consciousness, weakness and headaches.  Endo/Heme/Allergies:  Negative for environmental allergies. Does not bruise/bleed easily.  Psychiatric/Behavioral:  Negative for depression. The patient is not nervous/anxious and does not have insomnia.   Performance status (ECOG): 0  Vitals Blood pressure (!) 151/91, pulse 75, temperature 97.8 F (36.6 C), resp. rate 16, weight 245 lb (111.1 kg), SpO2 100 %.   Physical Exam Constitutional:      Appearance: He is not ill-appearing.  Musculoskeletal:     Comments: ambulatory  Skin:    General: Skin is dry.     Coloration:  Skin is not pale.     Findings: No bruising.  Neurological:     Mental Status: He is alert and oriented to person, place, and time.  Psychiatric:        Mood and Affect: Mood normal.        Behavior: Behavior normal.   Appointment on 08/01/2021  Component Date Value Ref Range Status   LDH 08/01/2021 147  98 - 192 U/L Final   Performed at [facility], [address] [place], [place] [address]   Sodium 08/01/2021 136  135 - 145 mmol/L Final   Potassium 08/01/2021 4.2  3.5 - 5.1 mmol/L Final   Chloride 08/01/2021 103  98 - 111 mmol/L Final   CO2 08/01/2021 25  22 - 32 mmol/L Final   Glucose, Bld 08/01/2021 155 (H)  70 - 99 mg/dL Final   Glucose reference range applies only to samples taken after fasting for at least 8 hours.   BUN 08/01/2021  26 (H)  8 - 23 mg/dL Final   Creatinine, Ser 08/01/2021 0.69  0.61 - 1.24 mg/dL Final   Calcium 08/01/2021 9.1  8.9 - 10.3 mg/dL Final   Total Protein 08/01/2021 7.5  6.5 - 8.1 g/dL Final   Albumin 08/01/2021 4.4  3.5 - 5.0 g/dL Final   AST 08/01/2021 22  15 - 41 U/L Final   ALT 08/01/2021 24  0 - 44 U/L Final   Alkaline Phosphatase 08/01/2021 73  38 - 126 U/L Final   Total Bilirubin 08/01/2021 0.6  0.3 - 1.2 mg/dL Final   GFR, Estimated 08/01/2021 >60  >60 mL/min Final   Comment: (NOTE) Calculated using the CKD-EPI Creatinine Equation (2021)    Anion gap 08/01/2021 8  5 - 15 Final   Performed at [facility], [place], [place] [address]   WBC 08/01/2021 5.4  4.0 - 10.5 K/uL Final   RBC 08/01/2021 5.28  4.22 - 5.81 MIL/uL Final   Hemoglobin 08/01/2021 14.9  13.0 - 17.0 g/dL Final   HCT 08/01/2021 43.1  39.0 - 52.0 % Final   MCV 08/01/2021 81.6  80.0 - 100.0 fL Final   MCH 08/01/2021 28.2  26.0 - 34.0 pg Final   MCHC 08/01/2021 34.6  30.0 - 36.0 g/dL Final   RDW 08/01/2021 13.3  11.5 - 15.5 % Final   Platelets 08/01/2021 200  150 - 400 K/uL Final   nRBC 08/01/2021 0.0  0.0 - 0.2 % Final   Neutrophils Relative  % 08/01/2021 69  % Final   Neutro Abs 08/01/2021 3.8  1.7 - 7.7 K/uL Final   Lymphocytes Relative 08/01/2021 20  % Final   Lymphs Abs 08/01/2021 1.1  0.7 - 4.0 K/uL Final   Monocytes Relative 08/01/2021 7  % Final   Monocytes Absolute 08/01/2021 0.4  0.1 - 1.0 K/uL Final   Eosinophils Relative 08/01/2021 2  % Final   Eosinophils Absolute 08/01/2021 0.1  0.0 - 0.5 K/uL Final   Basophils Relative 08/01/2021 1  % Final   Basophils Absolute 08/01/2021 0.0  0.0 - 0.1 K/uL Final   Immature Granulocytes 08/01/2021 1  % Final   Abs Immature Granulocytes 08/01/2021 0.04  0.00 - 0.07 K/uL Final   Performed at [facility], [address] [place], [place] [address]    Assessment & Plan: Stage IE diffuse large cell lymphoma left testicle- s/p 6 cycles of RCHOP followed by radiation. He received CNS prophylaxis with high dose MTX x 1 then IT MTX x 4. Complete remission based on post treatment imaging. Reviewed NCCN guidelines that additional imaging would be based on symptoms, lab abnormalities, or abnormal physical exam findings. He is now 10 years from his diagnosis and continues to remain NED. He can continue annual follow up with his pcp. Should he develop concerning symptoms, he can return to clinic for follow up.  Health maintenance- patient can follow up with his pcp  Disposition:  He does not require additional follow up at the [facility] and can continue annual surveillance exams with his PCP.   I discussed the assessment and treatment plan with the patient.  The patient was provided an opportunity to ask questions and all were answered.  The patient agreed with the plan and demonstrated an understanding of the instructions.  The patient was advised to call back if the symptoms worsen or if the condition fails to improve as anticipated.  Beckey Rutter, DNP, AGNP-[facility]  [facility] [phone] (clinic)  CC: Dr. Ronnald Ramp

## 2021-08-23 ENCOUNTER — Other Ambulatory Visit: Payer: Self-pay | Admitting: Family Medicine

## 2021-08-23 DIAGNOSIS — I10 Essential (primary) hypertension: Secondary | ICD-10-CM

## 2021-09-12 ENCOUNTER — Other Ambulatory Visit: Payer: Self-pay

## 2021-09-12 ENCOUNTER — Ambulatory Visit (INDEPENDENT_AMBULATORY_CARE_PROVIDER_SITE_OTHER): Payer: Medicare Other | Admitting: Family Medicine

## 2021-09-12 ENCOUNTER — Encounter: Payer: Self-pay | Admitting: Family Medicine

## 2021-09-12 VITALS — BP 120/80 | HR 80 | Ht 71.0 in | Wt 240.0 lb

## 2021-09-12 DIAGNOSIS — I10 Essential (primary) hypertension: Secondary | ICD-10-CM

## 2021-09-12 DIAGNOSIS — R739 Hyperglycemia, unspecified: Secondary | ICD-10-CM | POA: Diagnosis not present

## 2021-09-12 DIAGNOSIS — M720 Palmar fascial fibromatosis [Dupuytren]: Secondary | ICD-10-CM | POA: Diagnosis not present

## 2021-09-12 DIAGNOSIS — E785 Hyperlipidemia, unspecified: Secondary | ICD-10-CM | POA: Diagnosis not present

## 2021-09-12 MED ORDER — AMLODIPINE BESYLATE 10 MG PO TABS: ORAL_TABLET | ORAL | 1 refills | Status: DC

## 2021-09-12 NOTE — Progress Notes (Signed)
? ? ?Date:  09/12/2021  ? ?Name:  Nathan Gill   DOB:  1956/05/14   MRN:  [id number] ? ? ?Chief Complaint: Hypertension ? ?Hypertension ?This is a chronic problem. The current episode started more than 1 year ago. The problem has been gradually improving since onset. The problem is controlled. Pertinent negatives include no anxiety, blurred vision, chest pain, headaches, malaise/fatigue, neck pain, orthopnea, palpitations, peripheral edema, PND, shortness of breath or sweats. There are no associated agents to hypertension. Past treatments include calcium channel blockers. The current treatment provides no improvement. There are no compliance problems.  There is no history of angina, kidney disease, CAD/MI, CVA, heart failure, left ventricular hypertrophy, PVD or retinopathy. There is no history of chronic renal disease, a hypertension causing med or renovascular disease.  ? ?Lab Results  ?Component Value Date  ? NA 136 08/01/2021  ? K 4.2 08/01/2021  ? CO2 25 08/01/2021  ? GLUCOSE 155 (H) 08/01/2021  ? BUN 26 (H) 08/01/2021  ? CREATININE 0.69 08/01/2021  ? CALCIUM 9.1 08/01/2021  ? GFRNONAA >60 08/01/2021  ? ?Lab Results  ?Component Value Date  ? CHOL 233 (H) 11/09/2018  ? HDL 44 11/09/2018  ? LDLCALC 161 (H) 11/09/2018  ? TRIG 138 11/09/2018  ? ?No results found for: TSH ?No results found for: HGBA1C ?Lab Results  ?Component Value Date  ? WBC 5.4 08/01/2021  ? HGB 14.9 08/01/2021  ? HCT 43.1 08/01/2021  ? MCV 81.6 08/01/2021  ? PLT 200 08/01/2021  ? ?Lab Results  ?Component Value Date  ? ALT 24 08/01/2021  ? AST 22 08/01/2021  ? ALKPHOS 73 08/01/2021  ? BILITOT 0.6 08/01/2021  ? ?No results found for: 25OHVITD2, [place], VD25OH  ? ?Review of Systems  ?Constitutional:  Negative for chills, fever and malaise/fatigue.  ?HENT:  Negative for drooling, ear discharge, ear pain and sore throat.   ?Eyes:  Negative for blurred vision.  ?Respiratory:  Negative for cough, shortness of breath and wheezing.   ?Cardiovascular:   Negative for chest pain, palpitations, orthopnea, leg swelling and PND.  ?Gastrointestinal:  Negative for abdominal pain, blood in stool, constipation, diarrhea and nausea.  ?Endocrine: Negative for polydipsia.  ?Genitourinary:  Negative for dysuria, frequency, hematuria and urgency.  ?Musculoskeletal:  Negative for back pain, myalgias and neck pain.  ?Skin:  Negative for rash.  ?Allergic/Immunologic: Negative for environmental allergies.  ?Neurological:  Negative for dizziness and headaches.  ?Hematological:  Does not bruise/bleed easily.  ?Psychiatric/Behavioral:  Negative for suicidal ideas. The patient is not nervous/anxious.   ? ?Patient Active Problem List  ? Diagnosis Date Noted  ? Colon cancer screening   ? Polyp of ascending colon   ? Healthcare maintenance 06/30/2019  ? Essential hypertension 09/23/2016  ? Bilateral carpal tunnel syndrome 09/23/2016  ? Cervical disc disease 09/23/2016  ? Epicondylitis, lateral, left 09/23/2016  ? Gastroesophageal reflux disease without esophagitis 09/23/2016  ? Large cell lymphoma, extranodal and solid organ sites [facility]) 01/28/2016  ? ? ?No Known Allergies ? ?Past Surgical History:  ?Procedure Laterality Date  ? COLONOSCOPY WITH PROPOFOL N/A 04/25/2021  ? Procedure: COLONOSCOPY WITH BIOPSY;  Surgeon: Lucilla Lame, MD;  Location: [place];  Service: Endoscopy;  Laterality: N/A;  ? EXTERNAL EAR SURGERY    ? KNEE ARTHROSCOPY    ? POLYPECTOMY N/A 04/25/2021  ? Procedure: POLYPECTOMY;  Surgeon: Lucilla Lame, MD;  Location: [place];  Service: Endoscopy;  Laterality: N/A;  ? TESTICLE SURGERY    ? ? ?  Social History  ? ?Tobacco Use  ? Smoking status: Never  ? Smokeless tobacco: Never  ?Vaping Use  ? Vaping Use: Never used  ?Substance Use Topics  ? Alcohol use: Yes  ?  Comment: average 2 cans a month at most  ? Drug use: No  ? ? ? ?Medication list has been reviewed and updated. ? ?Current Meds  ?Medication Sig  ? acetaminophen (TYLENOL) 325 MG tablet Take 325 mg  by mouth every 6 (six) hours as needed.  ? amLODipine (NORVASC) 10 MG tablet TAKE ONE (1) TABLET BY MOUTH ONCE DAILY  ? meloxicam (MOBIC) 15 MG tablet Take 15 mg by mouth daily.  ? omeprazole (PRILOSEC) 20 MG capsule Take 1 capsule (20 mg total) by mouth daily. otc  ? ? ? ?  09/12/2021  ?  8:52 AM 11/07/2020  ?  9:37 AM 05/09/2020  ? 11:03 AM 06/22/2019  ?  9:39 AM  ?PHQ 2/9 Scores  ?PHQ - 2 Score 0 0 0 0  ?PHQ- 9 Score 0 0 0 0  ? ? ? ?  09/12/2021  ?  8:52 AM 11/07/2020  ?  9:37 AM 05/09/2020  ? 11:03 AM 06/22/2019  ?  9:39 AM  ?GAD 7 : Generalized Anxiety Score  ?Nervous, Anxious, on Edge 0 0 0 0  ?Control/stop worrying 0 0 0 0  ?Worry too much - different things 0 0 0 0  ?Trouble relaxing 0 0 0 0  ?Restless 0 0 0 0  ?Easily annoyed or irritable 0 0 0 0  ?Afraid - awful might happen 0 0 0 0  ?Total GAD 7 Score 0 0 0 0  ?Anxiety Difficulty Not difficult at all Not difficult at all    ? ? ?BP Readings from Last 3 Encounters:  ?09/12/21 120/80  ?08/01/21 (!) 151/91  ?04/25/21 (!) 145/95  ? ? ?Physical Exam ?Vitals and nursing note reviewed.  ?HENT:  ?   Head: Normocephalic.  ?   Right Ear: External ear normal.  ?   Left Ear: External ear normal.  ?   Nose: Nose normal. No congestion or rhinorrhea.  ?   Mouth/Throat:  ?   Pharynx: No oropharyngeal exudate or posterior oropharyngeal erythema.  ?Eyes:  ?   General: No scleral icterus.    ?   Right eye: No discharge.     ?   Left eye: No discharge.  ?   Conjunctiva/sclera: Conjunctivae normal.  ?   Pupils: Pupils are equal, round, and reactive to light.  ?Neck:  ?   Thyroid: No thyromegaly.  ?   Vascular: No JVD.  ?   Trachea: No tracheal deviation.  ?Cardiovascular:  ?   Rate and Rhythm: Normal rate and regular rhythm.  ?   Chest Wall: PMI is not displaced.  ?   Pulses: Normal pulses.  ?   Heart sounds: Normal heart sounds, S1 normal and S2 normal. No murmur heard. ?No systolic murmur is present.  ?No diastolic murmur is present.  ?  No friction rub. No gallop. No S3 or S4  sounds.  ?Pulmonary:  ?   Effort: No respiratory distress.  ?   Breath sounds: No wheezing, rhonchi or rales.  ?Abdominal:  ?   General: Bowel sounds are normal.  ?   Palpations: Abdomen is soft. There is no mass.  ?   Tenderness: There is no abdominal tenderness. There is no guarding or rebound.  ?Musculoskeletal:     ?   General:  No tenderness. Normal range of motion.  ?   Cervical back: Normal range of motion and neck supple.  ?   Right lower leg: No edema.  ?   Left lower leg: No edema.  ?Lymphadenopathy:  ?   Cervical: No cervical adenopathy.  ?Skin: ?   General: Skin is warm.  ?   Findings: No rash.  ?Neurological:  ?   Mental Status: He is alert and oriented to person, place, and time.  ?   Cranial Nerves: No cranial nerve deficit.  ?   Deep Tendon Reflexes: Reflexes are normal and symmetric.  ? ? ?Wt Readings from Last 3 Encounters:  ?09/12/21 240 lb (108.9 kg)  ?08/01/21 245 lb (111.1 kg)  ?04/25/21 242 lb (109.8 kg)  ? ? ?BP 120/80   Pulse 80   Ht '5\' 11"'$  (1.803 m)   Wt 240 lb (108.9 kg)   BMI 33.47 kg/m?  ? ?Assessment and Plan: ? ?1. Essential hypertension ?Chronic.  Controlled.  Stable.  Blood pressure 120/80.  Continue amlodipine 10 mg once a day. ?- amLODipine (NORVASC) 10 MG tablet; Take 1 tablet once a day  Dispense: 90 tablet; Refill: 1 ? ?2. Hyperglycemia ?Noted on the last blood sugar was elevated and we will check A1c but this is well as spot glucose today as well if elevated we will proceed with a dietary approach most likely. ?- HgB A1c ? ?3. Dupuytren contracture ?Patient with history of a Dupuytren contracture with no decrease in range of motion at this time.  We have discussed other means of treating this including physical therapy and we will be glad to refer to hand specialist when desires further evaluation. ? ?4. Hyperlipidemia, unspecified hyperlipidemia type ?Chronic.  Noted on previous lab.  Patient has elevated LDL with an increased ratio.  Guidelines have been given but we will  check a lipid panel fasting today. ?- Lipid Panel With LDL/HDL Ratio  ? ? ?

## 2021-09-12 NOTE — Patient Instructions (Addendum)
Dupuytren's Contracture ?Dupuytren's contracture is a condition in which tissue under the skin of the palm becomes thick. This causes one or more of the fingers to curl inward (contract) toward the palm. After a while, the fingers may not be able to straighten out. This condition affects some or all of the fingers and the palm of the hand. This condition may affect one or both hands. ?Dupuytren's contracture is a long-term (chronic) condition that develops (progresses) slowly over time. There is no cure, but symptoms can be managed and progression can be slowed with treatment. This condition is usually not dangerous or painful, but it can interfere with everyday tasks. ?What are the causes? ?This condition is caused by tissue (fascia) in the palm that gets thicker and tighter. When the fascia thickens, it pulls on the cords of tissue (tendons) that control finger movement. This causes the fingers to contract. ?The cause of fascia thickening is not known. However, the condition is often passed along from parent to child (inherited). ?What increases the risk? ?The following factors may make you more likely to develop this condition: ?Being 66 years of age or older. ?Being male. ?Having a family history of this condition. ?Using tobacco products, including cigarettes, chewing tobacco, and e-cigarettes. ?Drinking alcohol excessively. ?Having diabetes. ?Having a seizure disorder. ?What are the signs or symptoms? ?Early symptoms of this condition may include: ?Thick, puckered skin on the hand. ?One or more lumps (nodules) on the palm. Nodules may be tender when they first appear, but they are generally painless. ?Later symptoms of this condition may include: ?Thick cords of tissue in the palm. ?Fingers curled up toward the palm. ?Inability to straighten the fingers into their normal position. ?Though this condition is usually painless, you may have discomfort when holding or grabbing objects. ?How is this diagnosed? ?This  condition is diagnosed with a physical exam, which may include: ?Looking at your hands and feeling your palms. This is to check for thickened fascia and nodules. ?Measuring finger motion. ?Doing the [place] tabletop test. You may be asked to try to put your hand on a surface, with your palm down and your fingers straight out. ?How is this treated? ?There is no cure for this condition, but treatment can relieve discomfort and make symptoms more manageable. Treatment options may include: ?Physical therapy. This can strengthen your hand and increase flexibility. ?Occupational therapy. This can help you with everyday tasks that may be more difficult because of your condition. ?Shots (injections). Substances may be injected into your hand, such as: ?Medicines that help to decrease swelling (corticosteroids). ?Proteins (collagenase) to weaken thick tissue. After a collagenase injection, your health care provider may stretch your fingers. ?Needle aponeurotomy. A needle is pushed through the skin and into the fascia. Moving the needle against the fascia can weaken or break up the thick tissue. ?Surgery. This may be needed if your condition causes discomfort or interferes with everyday activities. Physical therapy is usually needed after surgery. ?No treatment is guaranteed to cure this condition. Recurrence of symptoms is common. ?Follow these instructions at home: ?Hand care ?Take these actions to help protect your hand from possible injury: ?Use tools that have padded grips. ?Wear protective gloves while you work with your hands. ?Avoid repetitive hand movements. ?General instructions ?Take over-the-counter and prescription medicines only as told by your health care provider. ?Manage any other conditions that you have, such as diabetes. ?If physical therapy was prescribed, do exercises as told by your health care provider. ?Do not use  any products that contain nicotine or tobacco, such as cigarettes, e-cigarettes, and  chewing tobacco. If you need help quitting, ask your health care provider. ?If you drink alcohol: ?Limit how much you have to: ?0-1 drink a day for women who are not pregnant. ?0-2 drinks a day for men. ?Be aware of how much alcohol is in your drink. In the [place] one drink equals one 12 oz bottle of beer (355 mL), one 5 oz glass of wine (148 mL), or one 1? oz glass of hard liquor (44 mL). ?Keep all follow-up visits as told by your health care provider. This is important. ?Contact a health care provider if: ?You develop new symptoms, or your symptoms get worse. ?You have pain that gets worse or does not get better with medicine. ?You have difficulty or discomfort with everyday tasks. ?You develop numbness or tingling. ?Get help right away if: ?You have severe pain. ?Your fingers change color or become unusually cold. ?Summary ?Dupuytren's contracture is a condition in which tissue under the skin of the palm becomes thick. ?This condition is caused by tissue (fascia) that thickens. When it thickens, it pulls on the cords of tissue (tendons) that control finger movement and makes the fingers contract. ?You are more likely to develop this condition if you are a man, are over 66 years of age, have a family history of the condition, and drink a lot of alcohol. ?This condition can be treated with physical and occupational therapy, injections, and surgery. Follow instructions about how to care for your hand. ?Get help right away if you have severe pain or your fingers change color or become cold. ?This information is not intended to replace advice given to you by your health care provider. Make sure you discuss any questions you have with your health care provider. ?Document Revised: 09/17/2020 Document Reviewed: 09/17/2020 ?Elsevier Patient Education ? [place]  ?LOW-CHOLESTEROL, LOW-TRIGLYCERIDE DIETS  ?  ?FOODS TO USE  ? ?MEATS, FISH Choose lean meats (chicken, [place], veal, and non-fatty cuts  of beef with excess fat trimmed; one serving = 3 oz of cooked meat). Also, fresh or frozen fish, canned fish packed in water, and shellfish (lobster, crabs, shrimp, and oysters). Limit use to no more than one serving of one of these per week. Shellfish are high in cholesterol but low in saturated fat and should be used sparingly. Meats and fish should be broiled (pan or oven) or baked on a rack.  ?EGGS Egg substitutes and egg whites (use freely). Egg yolks (limit two per week).  ?FRUITS Eat three servings of fresh fruit per day (1 serving = ? cup). Be sure to have at least one citrus fruit daily. Frozen and canned fruit with no sugar or syrup added may be used.  ?VEGETABLES Most vegetables are not limited (see next page). One dark-green (string beans, escarole) or one deep yellow (squash) vegetable is recommended daily. Cauliflower, broccoli, and celery, as well as potato skins, are recommended for their fiber content. (Fiber is associated with cholesterol reduction) It is preferable to steam vegetables, but they may be boiled, strained, or braised with polyunsaturated vegetable oil (see below).  ?BEANS Dried peas or beans (1 serving = ? cup) may be used as a bread substitute.  ?NUTS Almonds, walnuts, and peanuts may be used sparingly  ?(1 serving = 1 Tablespoonful). Use pumpkin, sesame, or sunflower seeds.  ?BREADS, GRAINS One roll or one slice of whole grain or enriched bread may be used, or  three soda crackers or four pieces of melba toast as a substitute. Spaghetti, rice or noodles (? cup) or ? large ear of corn may be used as a bread substitute. In preparing these foods do not use butter or shortening, use soft margarine. Also use egg and sugar substitutes.  Choose high fiber grains, such as oats and whole wheat.  ?CEREALS Use ? cup of hot cereal or ? cup of cold cereal per day. Add a sugar substitute if desired, with 99% fat free or skim milk.  ?MILK PRODUCTS Always use 99% fat free or skim milk, dairy  products such as low fat cheeses (farmer's uncreamed diet cottage), low-fat yogurt, and powdered skim milk.  ?FATS, OILS Use soft (not stick) margarine; vegetable oils that are high in polyunsaturated fats (suc

## 2021-09-13 LAB — LIPID PANEL WITH LDL/HDL RATIO
Cholesterol, Total: 147 mg/dL (ref 100–199)
HDL: 31 mg/dL — ABNORMAL LOW (ref >39)
LDL Chol Calc (NIH): 96 mg/dL (ref 0–99)
LDL/HDL Ratio: 3.1 ratio (ref 0.0–3.6)
Triglycerides: 105 mg/dL (ref 0–149)
VLDL Cholesterol Cal: 20 mg/dL (ref 5–40)

## 2021-09-13 LAB — HEMOGLOBIN A1C
Est. average glucose Bld gHb Est-mCnc: 120 mg/dL
Hgb A1c MFr Bld: 5.8 % — ABNORMAL HIGH (ref 4.8–5.6)

## 2022-03-10 ENCOUNTER — Other Ambulatory Visit: Payer: Self-pay | Admitting: Family Medicine

## 2022-03-10 DIAGNOSIS — I10 Essential (primary) hypertension: Secondary | ICD-10-CM

## 2022-03-10 NOTE — Telephone Encounter (Signed)
Requested Prescriptions  Pending Prescriptions Disp Refills  . amLODipine (NORVASC) 10 MG tablet [Pharmacy Med Name: AMLODIPINE BESYLATE 10 MG TAB] 90 tablet 1    Sig: TAKE (1) TABLET BY MOUTH EVERY DAY     Cardiovascular: Calcium Channel Blockers 2 Passed - 03/10/2022  8:12 AM      Passed - Last BP in normal range    BP Readings from Last 1 Encounters:  09/12/21 120/80         Passed - Last Heart Rate in normal range    Pulse Readings from Last 1 Encounters:  09/12/21 80         Passed - Valid encounter within last 6 months    Recent Outpatient Visits          5 months ago Essential hypertension   [facility] [place], Deanna C, MD   1 year ago Essential hypertension   [facility] [place], Deanna C, MD   1 year ago Degeneration, intervertebral disc, lumbar   [facility] [place], Deanna C, MD   1 year ago Essential hypertension   [facility] [place], Deanna C, MD   2 years ago Essential hypertension   [facility] [place], [place], MD      Future Appointments            In 6 days Juline Patch, MD [facility], [facility]

## 2022-03-16 ENCOUNTER — Ambulatory Visit: Payer: Self-pay | Admitting: Family Medicine

## 2022-03-18 DIAGNOSIS — D2271 Melanocytic nevi of right lower limb, including hip: Secondary | ICD-10-CM | POA: Diagnosis not present

## 2022-03-18 DIAGNOSIS — D2272 Melanocytic nevi of left lower limb, including hip: Secondary | ICD-10-CM | POA: Diagnosis not present

## 2022-03-18 DIAGNOSIS — D2261 Melanocytic nevi of right upper limb, including shoulder: Secondary | ICD-10-CM | POA: Diagnosis not present

## 2022-03-18 DIAGNOSIS — Z85828 Personal history of other malignant neoplasm of skin: Secondary | ICD-10-CM | POA: Diagnosis not present

## 2022-03-20 ENCOUNTER — Ambulatory Visit (INDEPENDENT_AMBULATORY_CARE_PROVIDER_SITE_OTHER): Payer: Medicare Other | Admitting: Family Medicine

## 2022-03-20 ENCOUNTER — Encounter: Payer: Self-pay | Admitting: Family Medicine

## 2022-03-20 VITALS — BP 130/84 | HR 76 | Ht 71.0 in | Wt 241.0 lb

## 2022-03-20 DIAGNOSIS — I10 Essential (primary) hypertension: Secondary | ICD-10-CM

## 2022-03-20 DIAGNOSIS — R7303 Prediabetes: Secondary | ICD-10-CM | POA: Diagnosis not present

## 2022-03-20 MED ORDER — AMLODIPINE BESYLATE 10 MG PO TABS: ORAL_TABLET | ORAL | 0 refills | Status: DC

## 2022-03-20 NOTE — Patient Instructions (Signed)

## 2022-03-20 NOTE — Progress Notes (Signed)
Date:  03/20/2022   Name:  Nathan Gill   DOB:  30-Nov-1955   MRN:  [id number]   Chief Complaint: Hypertension and Prediabetes  Hypertension This is a chronic problem. The current episode started more than 1 year ago. The problem has been gradually improving since onset. The problem is controlled. Pertinent negatives include no anxiety, blurred vision, chest pain, headaches, neck pain, orthopnea, palpitations, peripheral edema, PND or shortness of breath. There are no associated agents to hypertension. There are no known risk factors for coronary artery disease. Past treatments include calcium channel blockers. The current treatment provides mild improvement.    Lab Results  Component Value Date   NA 136 08/01/2021   K 4.2 08/01/2021   CO2 25 08/01/2021   GLUCOSE 155 (H) 08/01/2021   BUN 26 (H) 08/01/2021   CREATININE 0.69 08/01/2021   CALCIUM 9.1 08/01/2021   GFRNONAA >60 08/01/2021   Lab Results  Component Value Date   CHOL 147 09/12/2021   HDL 31 (L) 09/12/2021   LDLCALC 96 09/12/2021   TRIG 105 09/12/2021   No results found for: "TSH" Lab Results  Component Value Date   HGBA1C 5.8 (H) 09/12/2021   Lab Results  Component Value Date   WBC 5.4 08/01/2021   HGB 14.9 08/01/2021   HCT 43.1 08/01/2021   MCV 81.6 08/01/2021   PLT 200 08/01/2021   Lab Results  Component Value Date   ALT 24 08/01/2021   AST 22 08/01/2021   ALKPHOS 73 08/01/2021   BILITOT 0.6 08/01/2021   No results found for: "25OHVITD2", "25OHVITD3", "VD25OH"   Review of Systems  Constitutional:  Negative for chills and fever.  HENT:  Negative for drooling, ear discharge, ear pain and sore throat.   Eyes:  Negative for blurred vision.  Respiratory:  Negative for cough, shortness of breath and wheezing.   Cardiovascular:  Negative for chest pain, palpitations, orthopnea, leg swelling and PND.  Gastrointestinal:  Negative for abdominal pain, blood in stool, constipation, diarrhea and nausea.   Endocrine: Negative for polydipsia.  Genitourinary:  Negative for dysuria, frequency, hematuria and urgency.  Musculoskeletal:  Negative for back pain, myalgias and neck pain.  Skin:  Negative for rash.  Allergic/Immunologic: Negative for environmental allergies.  Neurological:  Negative for dizziness and headaches.  Hematological:  Does not bruise/bleed easily.  Psychiatric/Behavioral:  Negative for suicidal ideas. The patient is not nervous/anxious.     Patient Active Problem List   Diagnosis Date Noted   Colon cancer screening    Polyp of ascending colon    Healthcare maintenance 06/30/2019   Essential hypertension 09/23/2016   Bilateral carpal tunnel syndrome 09/23/2016   Cervical disc disease 09/23/2016   Epicondylitis, lateral, left 09/23/2016   Gastroesophageal reflux disease without esophagitis 09/23/2016   Large cell lymphoma, extranodal and solid organ sites ([place]) 01/28/2016    No Known Allergies  Past Surgical History:  Procedure Laterality Date   COLONOSCOPY WITH PROPOFOL N/A 04/25/2021   Procedure: COLONOSCOPY WITH BIOPSY;  Surgeon: Lucilla Lame, MD;  Location: [facility];  Service: Endoscopy;  Laterality: N/A;   EXTERNAL EAR SURGERY     KNEE ARTHROSCOPY     POLYPECTOMY N/A 04/25/2021   Procedure: POLYPECTOMY;  Surgeon: Lucilla Lame, MD;  Location: [facility];  Service: Endoscopy;  Laterality: N/A;   TESTICLE SURGERY      Social History   Tobacco Use   Smoking status: Never   Smokeless tobacco: Never  Vaping Use  Vaping Use: Never used  Substance Use Topics   Alcohol use: Yes    Comment: average 2 cans a month at most   Drug use: No     Medication list has been reviewed and updated.  Current Meds  Medication Sig   acetaminophen (TYLENOL) 325 MG tablet Take 325 mg by mouth every 6 (six) hours as needed.   amLODipine (NORVASC) 10 MG tablet TAKE (1) TABLET BY MOUTH EVERY DAY   meloxicam (MOBIC) 15 MG tablet Take 15 mg by mouth  daily.   omeprazole (PRILOSEC) 20 MG capsule Take 1 capsule (20 mg total) by mouth daily. otc       03/20/2022    9:54 AM 09/12/2021    8:52 AM 11/07/2020    9:37 AM 05/09/2020   11:03 AM  GAD 7 : Generalized Anxiety Score  Nervous, Anxious, on Edge 0 0 0 0  Control/stop worrying 0 0 0 0  Worry too much - different things 0 0 0 0  Trouble relaxing 0 0 0 0  Restless 0 0 0 0  Easily annoyed or irritable 0 0 0 0  Afraid - awful might happen 0 0 0 0  Total GAD 7 Score 0 0 0 0  Anxiety Difficulty Not difficult at all Not difficult at all Not difficult at all        03/20/2022    9:54 AM 09/12/2021    8:52 AM 11/07/2020    9:37 AM  Depression screen PHQ 2/9  Decreased Interest 0 0 0  Down, Depressed, Hopeless 0 0 0  PHQ - 2 Score 0 0 0  Altered sleeping 0 0 0  Tired, decreased energy 0 0 0  Change in appetite 0 0 0  Feeling bad or failure about yourself  0 0 0  Trouble concentrating 0 0 0  Moving slowly or fidgety/restless 0 0 0  Suicidal thoughts 0 0 0  PHQ-9 Score 0 0 0  Difficult doing work/chores Not difficult at all Not difficult at all Not difficult at all    BP Readings from Last 3 Encounters:  03/20/22 130/84  09/12/21 120/80  08/01/21 (!) 151/91    Physical Exam Vitals and nursing note reviewed.  HENT:     Head: Normocephalic.     Right Ear: External ear normal.     Left Ear: External ear normal.     Nose: Nose normal.     Mouth/Throat:     Mouth: Mucous membranes are moist.  Eyes:     General: No scleral icterus.       Right eye: No discharge.        Left eye: No discharge.     Conjunctiva/sclera: Conjunctivae normal.     Pupils: Pupils are equal, round, and reactive to light.  Neck:     Thyroid: No thyromegaly.     Vascular: No JVD.     Trachea: No tracheal deviation.  Cardiovascular:     Rate and Rhythm: Normal rate and regular rhythm.     Heart sounds: Normal heart sounds. No murmur heard.    No friction rub. No gallop.  Pulmonary:     Effort:  No respiratory distress.     Breath sounds: Normal breath sounds. No wheezing or rales.  Abdominal:     General: Bowel sounds are normal.     Palpations: Abdomen is soft. There is no mass.     Tenderness: There is no abdominal tenderness. There is no guarding or rebound.  Musculoskeletal:        General: No tenderness. Normal range of motion.     Cervical back: Normal range of motion and neck supple.  Lymphadenopathy:     Cervical: No cervical adenopathy.  Skin:    General: Skin is warm.     Findings: No rash.  Neurological:     Mental Status: He is alert.     Wt Readings from Last 3 Encounters:  03/20/22 241 lb (109.3 kg)  09/12/21 240 lb (108.9 kg)  08/01/21 245 lb (111.1 kg)    BP 130/84   Pulse 76   Ht '5\' 11"'$  (1.803 m)   Wt 241 lb (109.3 kg)   BMI 33.61 kg/m   Assessment and Plan:  1. Essential hypertension Chronic.  Controlled.  Stable.  Blood pressure today is 130/84.  Continue amlodipine 10 mg once a day.  We will check renal function panel for electrolytes and GFR. - amLODipine (NORVASC) 10 MG tablet; TAKE (1) TABLET BY MOUTH EVERY DAY  Dispense: 90 tablet; Refill: 0 - Renal Function Panel  2. Prediabetes Chronic.  Diet controlled.  Stable.  Last A1c was 5.8 we will repeat A1c and a and just treatment regimen accordingly. - HgB A1c    Otilio Miu, MD

## 2022-04-06 ENCOUNTER — Telehealth: Payer: Self-pay | Admitting: Family Medicine

## 2022-04-06 NOTE — Telephone Encounter (Signed)
Copied from [place] [phone]. Topic: Medicare AWV >> Apr 06, 2022 11:14 AM Jae Dire wrote: Reason for CRM:   Left message for patient to call back and schedule Medicare Annual Wellness Visit (AWV) in office.   If unable to come into the office for AWV,  please offer to do virtually or by telephone.  No hx of AWV eligible for AWVI per [employer] as of 02/20/2022  Please schedule at any time with [facility] -Nurse Health Advisor.      45 minute appointment   Any questions, please call me at [phone]

## 2022-04-08 ENCOUNTER — Telehealth: Payer: Self-pay | Admitting: Family Medicine

## 2022-04-08 NOTE — Telephone Encounter (Signed)
Copied from [place] [phone]. Topic: General - Other >> Apr 08, 2022  2:25 PM [place] B wrote: Reason for CRM: Patient called in about getting lab work done for cholesterol, etc. I don't see order in. He says he was waiting for his hard copy of insurance card to get lab work done.

## 2022-04-09 ENCOUNTER — Telehealth: Payer: Self-pay | Admitting: Family Medicine

## 2022-04-09 ENCOUNTER — Other Ambulatory Visit: Payer: Self-pay

## 2022-04-09 DIAGNOSIS — R7303 Prediabetes: Secondary | ICD-10-CM

## 2022-04-09 DIAGNOSIS — I10 Essential (primary) hypertension: Secondary | ICD-10-CM

## 2022-04-09 NOTE — Telephone Encounter (Signed)
Copied from [place] [phone]. Topic: General - Call Back - No Documentation >> Apr 09, 2022  8:17 AM Leitha Schuller wrote: Pt returning call, please assist further

## 2022-04-09 NOTE — Telephone Encounter (Signed)
Spoke to patient he will be in sometime tomorrow morning between 8 and 9

## 2022-04-10 DIAGNOSIS — I10 Essential (primary) hypertension: Secondary | ICD-10-CM | POA: Diagnosis not present

## 2022-04-10 DIAGNOSIS — R7303 Prediabetes: Secondary | ICD-10-CM | POA: Diagnosis not present

## 2022-04-11 LAB — RENAL FUNCTION PANEL
Albumin: 4.6 g/dL (ref 3.9–4.9)
BUN/Creatinine Ratio: 26 — ABNORMAL HIGH (ref 10–24)
BUN: 21 mg/dL (ref 8–27)
CO2: 25 mmol/L (ref 20–29)
Calcium: 9.4 mg/dL (ref 8.6–10.2)
Chloride: 101 mmol/L (ref 96–106)
Creatinine, Ser: 0.81 mg/dL (ref 0.76–1.27)
Glucose: 138 mg/dL — ABNORMAL HIGH (ref 70–99)
Phosphorus: 4.2 mg/dL — ABNORMAL HIGH (ref 2.8–4.1)
Potassium: 4.7 mmol/L (ref 3.5–5.2)
Sodium: 139 mmol/L (ref 134–144)
eGFR: 97 mL/min/{1.73_m2} (ref >59)

## 2022-04-11 LAB — HEMOGLOBIN A1C
Est. average glucose Bld gHb Est-mCnc: 128 mg/dL
Hgb A1c MFr Bld: 6.1 % — ABNORMAL HIGH (ref 4.8–5.6)

## 2022-07-23 ENCOUNTER — Ambulatory Visit (INDEPENDENT_AMBULATORY_CARE_PROVIDER_SITE_OTHER): Payer: Self-pay

## 2022-07-23 VITALS — Ht 71.0 in | Wt 241.0 lb

## 2022-07-23 DIAGNOSIS — Z0000 Encounter for general adult medical examination without abnormal findings: Secondary | ICD-10-CM

## 2022-07-23 NOTE — Progress Notes (Cosign Needed Addendum)
Virtual Visit via Telephone Note  I connected with  Nathan Gill on 07/31/22 at  8:15 AM EST by telephone and verified that I am speaking with the correct person using two identifiers.  Location: Patient: home  Provider: [facility] participating in the virtual visit: patient/Nurse Health Advisor   I discussed the limitations, risks, security and privacy concerns of performing an evaluation and management service by telephone and the availability of in person appointments. The patient expressed understanding and agreed to proceed.  Interactive audio and video telecommunications were attempted between this nurse and patient, however failed, due to patient having technical difficulties OR patient did not have access to video capability.  We continued and completed visit with audio only.  Some vital signs may be absent or patient reported.   Nathan David, LPN  Subjective:   Nathan Gill is a 67 y.o. male who presents for an Initial Medicare Annual Wellness Visit.  Review of Systems     Cardiac Risk Factors include: advanced age (>4mn, >>12women);male gender;hypertension     Objective:    Today's Vitals   07/23/22 0833  Weight: 241 lb (109.3 kg)  Height: 5' 11"$  (1.803 m)   Body mass index is 33.61 kg/m.     07/23/2022    8:20 AM 08/01/2021    8:56 AM 04/25/2021    9:41 AM 08/30/2020   10:28 AM 06/29/2019    1:31 PM 02/01/2018    8:33 AM 01/26/2017    9:09 AM  Advanced Directives  Does Patient Have a Medical Advance Directive? No No No No Yes Yes Yes  Type of APersonnel officerLiving will Living will;Healthcare Power of [place]  Does patient want to make changes to medical advance directive?      No - Patient declined   Copy of HHallidayin Chart?     No - copy requested No - copy requested   Would patient like information on creating a medical advance directive? No -  Patient declined No - Patient declined No - Patient declined        Current Medications (verified) Outpatient Encounter Medications as of 07/23/2022  Medication Sig   acetaminophen (TYLENOL) 325 MG tablet Take 325 mg by mouth every 6 (six) hours as needed.   amLODipine (NORVASC) 10 MG tablet TAKE (1) TABLET BY MOUTH EVERY DAY   meloxicam (MOBIC) 15 MG tablet Take 15 mg by mouth daily.   omeprazole (PRILOSEC) 20 MG capsule Take 1 capsule (20 mg total) by mouth daily. otc   No facility-administered encounter medications on file as of 07/23/2022.    Allergies (verified) Patient has no known allergies.   History: Past Medical History:  Diagnosis Date   Arthritis    Elbows, wrists, knees   Cancer (HCC)    GERD (gastroesophageal reflux disease)    Hypertension    [place] ([place] 06[place]   chemo/radiation treatment   Wears hearing aid in left ear    Past Surgical History:  Procedure Laterality Date   COLONOSCOPY WITH PROPOFOL N/A 04/25/2021   Procedure: COLONOSCOPY WITH BIOPSY;  Surgeon: WLucilla Lame MD;  [facility]  Service: Endoscopy;  Laterality: N/A;   EXTERNAL EAR SURGERY     KNEE ARTHROSCOPY     POLYPECTOMY N/A 04/25/2021   Procedure: POLYPECTOMY;  Surgeon: WLucilla Lame MD;  [facility]  Service: Endoscopy;  Laterality:  N/A;   TESTICLE SURGERY     Family History  Problem Relation Age of Onset   Lung cancer Father    Leukemia Maternal Uncle    Melanoma Paternal Grandfather    Colon cancer Cousin    Social History   Socioeconomic History   Marital status: Divorced    Spouse name: Not on file   Number of children: Not on file   Years of education: Not on file   Highest education level: Not on file  Occupational History   Not on file  Tobacco Use   Smoking status: Never   Smokeless tobacco: Never  Vaping Use   Vaping Use: Never used  Substance and Sexual Activity   Alcohol use: Yes    Comment: average 2 cans a month at  most   Drug use: No   Sexual activity: Not on file  Other Topics Concern   Not on file  Social History Narrative   Not on file   Social Determinants of Health   Financial Resource Strain: Low Risk  (07/23/2022)   Overall Financial Resource Strain (CARDIA)    Difficulty of Paying Living Expenses: Not hard at all  Food Insecurity: No Food Insecurity (07/23/2022)   Hunger Vital Sign    Worried About Running Out of Food in the Last Year: Never true    Melissa in the Last Year: Never true  Transportation Needs: No Transportation Needs (07/23/2022)   PRAPARE - Hydrologist (Medical): No    Lack of Transportation (Non-Medical): No  Physical Activity: Sufficiently Active (07/23/2022)   Exercise Vital Sign    Days of Exercise per Week: 3 days    Minutes of Exercise per Session: 90 min  Stress: No Stress Concern Present (07/23/2022)   [place]    Feeling of Stress : Not at all  Social Connections: Moderately Isolated (07/23/2022)   Social Connection and Isolation Panel [NHANES]    Frequency of Communication with Friends and Family: More than three times a week    Frequency of Social Gatherings with Friends and Family: Once a week    Attends Religious Services: Never    Marine scientist or Organizations: No    Attends Music therapist: Never    Marital Status: Living with partner    Tobacco Counseling Counseling given: Not Answered   Clinical Intake:  Pre-visit preparation completed: Yes  Pain : No/denies pain     Nutritional Risks: None Diabetes: No  How often do you need to have someone help you when you read instructions, pamphlets, or other written materials from your doctor or pharmacy?: 1 - Never  Diabetic?no  Interpreter Needed?: No  Information entered by :: Kirke Shaggy, LPN   Activities of Daily Living    07/23/2022    8:21 AM  In your present  state of health, do you have any difficulty performing the following activities:  Hearing? 0  Vision? 0  Difficulty concentrating or making decisions? 0  Walking or climbing stairs? 0  Dressing or bathing? 0  Doing errands, shopping? 0  Preparing Food and eating ? N  Using the Toilet? N  In the past six months, have you accidently leaked urine? N  Do you have problems with loss of bowel control? N  Managing your Medications? N  Managing your Finances? N  Housekeeping or managing your Housekeeping? N    Patient Care Team:  Juline Patch, MD as PCP - General (Family Medicine) Verlon Au, NP as Nurse Practitioner (Oncology)  Indicate any recent Medical Services you may have received from other than [facility] providers in the past year (date may be approximate).     Assessment:   This is a routine wellness examination for [place].  Hearing/Vision screen Hearing Screening - Comments:: Wears aids Vision Screening - Comments:: Readers-   Dietary issues and exercise activities discussed: Current Exercise Habits: Home exercise routine, Time (Minutes): > 60, Frequency (Times/Week): 3, Weekly Exercise (Minutes/Week): 0, Intensity: Moderate   Goals Addressed             This Visit's Progress    DIET - INCREASE WATER INTAKE         Depression Screen    07/23/2022    8:18 AM 03/20/2022    9:54 AM 09/12/2021    8:52 AM 11/07/2020    9:37 AM 05/09/2020   11:03 AM 06/22/2019    9:39 AM 11/09/2018    8:13 AM  PHQ 2/9 Scores  PHQ - 2 Score 0 0 0 0 0 0 0  PHQ- 9 Score 0 0 0 0 0 0 0    Fall Risk    07/23/2022    8:20 AM 03/20/2022    9:54 AM 11/07/2020    9:37 AM 05/09/2020   11:03 AM 06/22/2019    9:39 AM  Fall Risk   Falls in the past year? 0 0 0 0 0  Number falls in past yr: 1 0 0    Comment on mountain bike      Injury with Fall? 0 0 0    Risk for fall due to : History of fall(s) No Fall Risks     Follow up Falls prevention discussed;Falls evaluation completed Falls  evaluation completed Falls evaluation completed Falls evaluation completed Falls evaluation completed    FALL RISK PREVENTION PERTAINING TO THE HOME:  Any stairs in or around the home? Yes  If so, are there any without handrails? No  Home free of loose throw rugs in walkways, pet beds, electrical cords, etc? Yes  Adequate lighting in your home to reduce risk of falls? Yes   ASSISTIVE DEVICES UTILIZED TO PREVENT FALLS:  Life alert? No  Use of a cane, walker or w/c? No  Grab bars in the bathroom? No  Shower chair or bench in shower? No  Elevated toilet seat or a handicapped toilet? No   Cognitive Function:        07/23/2022    8:29 AM 07/23/2022    8:22 AM  6CIT Screen  What Year? 0 points 0 points  What month? 0 points 0 points  What time? 0 points 0 points  Count back from 20 0 points 0 points  Months in reverse 0 points 0 points  Repeat phrase 0 points 0 points  Total Score 0 points 0 points    Immunizations Immunization History  Administered Date(s) Administered   Fluad Quad(high Dose 65+) 03/13/2022   Influenza Inj Mdck Quad Pf 06/27/2017   Influenza,inj,Quad PF,6+ Mos 02/15/2018   Influenza,inj,quad, With Preservative 04/21/2019   Influenza-Unspecified 04/21/2019, 04/13/2020, 04/03/2021   Moderna SARS-COV2 Booster Vaccination 03/13/2022   Moderna Sars-Covid-2 Vaccination 08/29/2019, 09/21/2019, 09/26/2019, 04/21/2020   Tdap 09/23/2016   Zoster Recombinat (Shingrix) 09/21/2019, 04/03/2020    TDAP status: Up to date  Flu Vaccine status: Up to date  Pneumococcal vaccine status: Declined,  Education has been provided regarding the importance of  this vaccine but patient still declined. Advised may receive this vaccine at local pharmacy or Health Dept. Aware to provide a copy of the vaccination record if obtained from local pharmacy or Health Dept. Verbalized acceptance and understanding.   Covid-19 vaccine status: Completed vaccines  Qualifies for Shingles Vaccine?  Yes   Zostavax completed No   Shingrix Completed?: Yes  Screening Tests Health Maintenance  Topic Date Due   COVID-19 Vaccine (5 - 2023-24 season) 05/08/2022   Pneumonia Vaccine 63+ Years old (1 of 1 - PCV) 09/13/2022 (Originally 03/16/2021)   Hepatitis C Screening  09/13/2022 (Originally 03/16/1974)   DTaP/Tdap/Td (2 - Td or Tdap) 09/24/2026   COLONOSCOPY (Pts 45-24yr Insurance coverage will need to be confirmed)  04/25/2028   INFLUENZA VACCINE  Completed   Zoster Vaccines- Shingrix  Completed   HPV VACCINES  Aged Out    Health Maintenance  Health Maintenance Due  Topic Date Due   COVID-19 Vaccine (5 - 2023-24 season) 05/08/2022    Colorectal cancer screening: Type of screening: Colonoscopy. Completed 04/25/21. Repeat every 7 years  Lung Cancer Screening: (Low Dose CT Chest recommended if Age 67-80years, 30 pack-year currently smoking OR have quit w/in 15years.) does not qualify.    Additional Screening:  Hepatitis C Screening: does qualify; Completed no  Vision Screening: Recommended annual ophthalmology exams for early detection of glaucoma and other disorders of the eye. Is the patient up to date with their annual eye exam?  No  Who is the provider or what is the name of the office in which the patient attends annual eye exams? No one If pt is not established with a provider, would they like to be referred to a provider to establish care? No .   Dental Screening: Recommended annual dental exams for proper oral hygiene  Community Resource Referral / Chronic Care Management: CRR required this visit?  No   CCM required this visit?  No      Plan:     I have personally reviewed and noted the following in the patient's chart:   Medical and social history Use of alcohol, tobacco or illicit drugs  Current medications and supplements including opioid prescriptions. Patient is not currently taking opioid prescriptions. Functional ability and status Nutritional  status Physical activity Advanced directives List of other physicians Hospitalizations, surgeries, and ER visits in previous 12 months Vitals Screenings to include cognitive, depression, and falls Referrals and appointments  In addition, I have reviewed and discussed with patient certain preventive protocols, quality metrics, and best practice recommendations. A written personalized care plan for preventive services as well as general preventive health recommendations were provided to patient.     LDionisio David LPN   [id number]  Nurse Notes: none

## 2022-07-23 NOTE — Patient Instructions (Signed)
Nathan Gill , Thank you for taking time to come for your Medicare Wellness Visit. I appreciate your ongoing commitment to your health goals. Please review the following plan we discussed and let me know if I can assist you in the future.   These are the goals we discussed:  Goals      DIET - INCREASE WATER INTAKE        This is a list of the screening recommended for you and due dates:  Health Maintenance  Topic Date Due   COVID-19 Vaccine (5 - 2023-24 season) 05/08/2022   Pneumonia Vaccine (1 - PCV) 09/13/2022*   Hepatitis C Screening: USPSTF Recommendation to screen - Ages 18-79 yo.  09/13/2022*   DTaP/Tdap/Td vaccine (2 - Td or Tdap) 09/24/2026   Colon Cancer Screening  04/25/2028   Flu Shot  Completed   Zoster (Shingles) Vaccine  Completed   HPV Vaccine  Aged Out  *Topic was postponed. The date shown is not the original due date.    Advanced directives: no  Conditions/risks identified: none  Next appointment: Follow up in one year for your annual wellness visit. 07/28/23 @ 11:30 by phone  Preventive Care 65 Years and Older, Male  Preventive care refers to lifestyle choices and visits with your health care provider that can promote health and wellness. What does preventive care include? A yearly physical exam. This is also called an annual well check. Dental exams once or twice a year. Routine eye exams. Ask your health care provider how often you should have your eyes checked. Personal lifestyle choices, including: Daily care of your teeth and gums. Regular physical activity. Eating a healthy diet. Avoiding tobacco and drug use. Limiting alcohol use. Practicing safe sex. Taking low doses of aspirin every day. Taking vitamin and mineral supplements as recommended by your health care provider. What happens during an annual well check? The services and screenings done by your health care provider during your annual well check will depend on your age, overall health,  lifestyle risk factors, and family history of disease. Counseling  Your health care provider may ask you questions about your: Alcohol use. Tobacco use. Drug use. Emotional well-being. Home and relationship well-being. Sexual activity. Eating habits. History of falls. Memory and ability to understand (cognition). Work and work Statistician. Screening  You may have the following tests or measurements: Height, weight, and BMI. Blood pressure. Lipid and cholesterol levels. These may be checked every 5 years, or more frequently if you are over 28 years old. Skin check. Lung cancer screening. You may have this screening every year starting at age 30 if you have a 30-pack-year history of smoking and currently smoke or have quit within the past 15 years. Fecal occult blood test (FOBT) of the stool. You may have this test every year starting at age 24. Flexible sigmoidoscopy or colonoscopy. You may have a sigmoidoscopy every 5 years or a colonoscopy every 10 years starting at age 59. Prostate cancer screening. Recommendations will vary depending on your family history and other risks. Hepatitis C blood test. Hepatitis B blood test. Sexually transmitted disease (STD) testing. Diabetes screening. This is done by checking your blood sugar (glucose) after you have not eaten for a while (fasting). You may have this done every 1-3 years. Abdominal aortic aneurysm (AAA) screening. You may need this if you are a current or former smoker. Osteoporosis. You may be screened starting at age 71 if you are at high risk. Talk with your health care  provider about your test results, treatment options, and if necessary, the need for more tests. Vaccines  Your health care provider may recommend certain vaccines, such as: Influenza vaccine. This is recommended every year. Tetanus, diphtheria, and acellular pertussis (Tdap, Td) vaccine. You may need a Td booster every 10 years. Zoster vaccine. You may need this  after age 22. Pneumococcal 13-valent conjugate (PCV13) vaccine. One dose is recommended after age 83. Pneumococcal polysaccharide (PPSV23) vaccine. One dose is recommended after age 40. Talk to your health care provider about which screenings and vaccines you need and how often you need them. This information is not intended to replace advice given to you by your health care provider. Make sure you discuss any questions you have with your health care provider. Document Released: 07/05/2015 Document Revised: 02/26/2016 Document Reviewed: 04/09/2015 Elsevier Interactive Patient Education  2017 [place] Prevention in the Home Falls can cause injuries. They can happen to people of all ages. There are many things you can do to make your home safe and to help prevent falls. What can I do on the outside of my home? Regularly fix the edges of walkways and driveways and fix any cracks. Remove anything that might make you trip as you walk through a door, such as a raised step or threshold. Trim any bushes or trees on the path to your home. Use bright outdoor lighting. Clear any walking paths of anything that might make someone trip, such as rocks or tools. Regularly check to see if handrails are loose or broken. Make sure that both sides of any steps have handrails. Any raised decks and porches should have guardrails on the edges. Have any leaves, snow, or ice cleared regularly. Use sand or salt on walking paths during winter. Clean up any spills in your garage right away. This includes oil or grease spills. What can I do in the bathroom? Use night lights. Install grab bars by the toilet and in the tub and shower. Do not use towel bars as grab bars. Use non-skid mats or decals in the tub or shower. If you need to sit down in the shower, use a plastic, non-slip stool. Keep the floor dry. Clean up any water that spills on the floor as soon as it happens. Remove soap buildup in the tub or  shower regularly. Attach bath mats securely with double-sided non-slip rug tape. Do not have throw rugs and other things on the floor that can make you trip. What can I do in the bedroom? Use night lights. Make sure that you have a light by your bed that is easy to reach. Do not use any sheets or blankets that are too big for your bed. They should not hang down onto the floor. Have a firm chair that has side arms. You can use this for support while you get dressed. Do not have throw rugs and other things on the floor that can make you trip. What can I do in the kitchen? Clean up any spills right away. Avoid walking on wet floors. Keep items that you use a lot in easy-to-reach places. If you need to reach something above you, use a strong step stool that has a grab bar. Keep electrical cords out of the way. Do not use floor polish or wax that makes floors slippery. If you must use wax, use non-skid floor wax. Do not have throw rugs and other things on the floor that can make you trip. What can I  do with my stairs? Do not leave any items on the stairs. Make sure that there are handrails on both sides of the stairs and use them. Fix handrails that are broken or loose. Make sure that handrails are as long as the stairways. Check any carpeting to make sure that it is firmly attached to the stairs. Fix any carpet that is loose or worn. Avoid having throw rugs at the top or bottom of the stairs. If you do have throw rugs, attach them to the floor with carpet tape. Make sure that you have a light switch at the top of the stairs and the bottom of the stairs. If you do not have them, ask someone to add them for you. What else can I do to help prevent falls? Wear shoes that: Do not have high heels. Have rubber bottoms. Are comfortable and fit you well. Are closed at the toe. Do not wear sandals. If you use a stepladder: Make sure that it is fully opened. Do not climb a closed stepladder. Make  sure that both sides of the stepladder are locked into place. Ask someone to hold it for you, if possible. Clearly mark and make sure that you can see: Any grab bars or handrails. First and last steps. Where the edge of each step is. Use tools that help you move around (mobility aids) if they are needed. These include: Canes. Walkers. Scooters. Crutches. Turn on the lights when you go into a dark area. Replace any light bulbs as soon as they burn out. Set up your furniture so you have a clear path. Avoid moving your furniture around. If any of your floors are uneven, fix them. If there are any pets around you, be aware of where they are. Review your medicines with your doctor. Some medicines can make you feel dizzy. This can increase your chance of falling. Ask your doctor what other things that you can do to help prevent falls. This information is not intended to replace advice given to you by your health care provider. Make sure you discuss any questions you have with your health care provider. Document Released: 04/04/2009 Document Revised: 11/14/2015 Document Reviewed: 07/13/2014 Elsevier Interactive Patient Education  2017 [employer].

## 2022-09-18 ENCOUNTER — Ambulatory Visit (INDEPENDENT_AMBULATORY_CARE_PROVIDER_SITE_OTHER): Payer: Self-pay | Admitting: Family Medicine

## 2022-09-18 ENCOUNTER — Encounter: Payer: Self-pay | Admitting: Family Medicine

## 2022-09-18 VITALS — BP 118/78 | HR 74 | Ht 71.0 in | Wt 224.0 lb

## 2022-09-18 DIAGNOSIS — R7303 Prediabetes: Secondary | ICD-10-CM

## 2022-09-18 DIAGNOSIS — I10 Essential (primary) hypertension: Secondary | ICD-10-CM

## 2022-09-18 DIAGNOSIS — M5090 Cervical disc disorder, unspecified, unspecified cervical region: Secondary | ICD-10-CM

## 2022-09-18 DIAGNOSIS — R351 Nocturia: Secondary | ICD-10-CM

## 2022-09-18 DIAGNOSIS — E785 Hyperlipidemia, unspecified: Secondary | ICD-10-CM

## 2022-09-18 MED ORDER — AMLODIPINE BESYLATE 10 MG PO TABS: ORAL_TABLET | ORAL | 1 refills | Status: DC

## 2022-09-18 MED ORDER — MELOXICAM 15 MG PO TABS: 15.0000 mg | ORAL_TABLET | Freq: Every day | ORAL | 1 refills | Status: DC

## 2022-09-18 NOTE — Patient Instructions (Signed)

## 2022-09-18 NOTE — Progress Notes (Signed)
Date:  09/18/2022   Name:  Nathan Gill   DOB:  1955/09/21   MRN:  [id number]   Chief Complaint: Hypertension, Arthritis (Takes meloxicam for arthritis pain), and Prediabetes  Hypertension This is a chronic problem. The current episode started more than 1 year ago. The problem is controlled. Pertinent negatives include no blurred vision, chest pain, headaches, neck pain, orthopnea, palpitations or shortness of breath. There are no associated agents to hypertension. There are no known risk factors for coronary artery disease. Past treatments include calcium channel blockers. The current treatment provides moderate improvement. There are no compliance problems.  There is no history of angina, kidney disease, CAD/MI, CVA or left ventricular hypertrophy. There is no history of chronic renal disease, a hypertension causing med or renovascular disease.  Arthritis Presents for follow-up visit. He reports no joint warmth. The symptoms have been stable. Pertinent negatives include no diarrhea, dysuria, fever or rash.    Lab Results  Component Value Date   NA 139 04/10/2022   K 4.7 04/10/2022   CO2 25 04/10/2022   GLUCOSE 138 (H) 04/10/2022   BUN 21 04/10/2022   CREATININE 0.81 04/10/2022   CALCIUM 9.4 04/10/2022   EGFR 97 04/10/2022   GFRNONAA >60 08/01/2021   Lab Results  Component Value Date   CHOL 147 09/12/2021   HDL 31 (L) 09/12/2021   LDLCALC 96 09/12/2021   TRIG 105 09/12/2021   No results found for: "TSH" Lab Results  Component Value Date   HGBA1C 6.1 (H) 04/10/2022   Lab Results  Component Value Date   WBC 5.4 08/01/2021   HGB 14.9 08/01/2021   HCT 43.1 08/01/2021   MCV 81.6 08/01/2021   PLT 200 08/01/2021   Lab Results  Component Value Date   ALT 24 08/01/2021   AST 22 08/01/2021   ALKPHOS 73 08/01/2021   BILITOT 0.6 08/01/2021   No results found for: "25OHVITD2", "25OHVITD3", "VD25OH"   Review of Systems  Constitutional:  Negative for chills and fever.   HENT:  Negative for drooling, ear discharge, ear pain and sore throat.   Eyes:  Negative for blurred vision.  Respiratory:  Negative for cough, shortness of breath and wheezing.   Cardiovascular:  Negative for chest pain, palpitations, orthopnea and leg swelling.  Gastrointestinal:  Negative for abdominal pain, blood in stool, constipation, diarrhea and nausea.  Endocrine: Negative for polydipsia.  Genitourinary:  Negative for dysuria, frequency, hematuria and urgency.  Musculoskeletal:  Positive for arthritis. Negative for back pain, myalgias and neck pain.  Skin:  Negative for rash.  Allergic/Immunologic: Negative for environmental allergies.  Neurological:  Negative for dizziness and headaches.  Hematological:  Does not bruise/bleed easily.  Psychiatric/Behavioral:  Negative for suicidal ideas. The patient is not nervous/anxious.     Patient Active Problem List   Diagnosis Date Noted   Colon cancer screening    Polyp of ascending colon    Healthcare maintenance 06/30/2019   Essential hypertension 09/23/2016   Bilateral carpal tunnel syndrome 09/23/2016   Cervical disc disease 09/23/2016   Epicondylitis, lateral, left 09/23/2016   Gastroesophageal reflux disease without esophagitis 09/23/2016   Large cell lymphoma, extranodal and solid organ sites ([place]) 01/28/2016    No Known Allergies  Past Surgical History:  Procedure Laterality Date   COLONOSCOPY WITH PROPOFOL N/A 04/25/2021   Procedure: COLONOSCOPY WITH BIOPSY;  Surgeon: Lucilla Lame, MD;  Location: [facility];  Service: Endoscopy;  Laterality: N/A;   EXTERNAL EAR SURGERY  KNEE ARTHROSCOPY     POLYPECTOMY N/A 04/25/2021   Procedure: POLYPECTOMY;  Surgeon: Lucilla Lame, MD;  Location: [facility];  Service: Endoscopy;  Laterality: N/A;   TESTICLE SURGERY      Social History   Tobacco Use   Smoking status: Never   Smokeless tobacco: Never  Vaping Use   Vaping Use: Never used  Substance Use  Topics   Alcohol use: Yes    Comment: average 2 cans a month at most   Drug use: No     Medication list has been reviewed and updated.  Current Meds  Medication Sig   acetaminophen (TYLENOL) 325 MG tablet Take 325 mg by mouth every 6 (six) hours as needed.   amLODipine (NORVASC) 10 MG tablet TAKE (1) TABLET BY MOUTH EVERY DAY   meloxicam (MOBIC) 15 MG tablet Take 15 mg by mouth daily.   omeprazole (PRILOSEC) 20 MG capsule Take 1 capsule (20 mg total) by mouth daily. otc       09/18/2022   10:25 AM 03/20/2022    9:54 AM 09/12/2021    8:52 AM 11/07/2020    9:37 AM  GAD 7 : Generalized Anxiety Score  Nervous, Anxious, on Edge 0 0 0 0  Control/stop worrying 0 0 0 0  Worry too much - different things 0 0 0 0  Trouble relaxing 0 0 0 0  Restless 0 0 0 0  Easily annoyed or irritable 0 0 0 0  Afraid - awful might happen 0 0 0 0  Total GAD 7 Score 0 0 0 0  Anxiety Difficulty Not difficult at all Not difficult at all Not difficult at all Not difficult at all       09/18/2022   10:24 AM 07/23/2022    8:18 AM 03/20/2022    9:54 AM  Depression screen PHQ 2/9  Decreased Interest 0 0 0  Down, Depressed, Hopeless 0 0 0  PHQ - 2 Score 0 0 0  Altered sleeping 0 0 0  Tired, decreased energy 0 0 0  Change in appetite 0 0 0  Feeling bad or failure about yourself  0 0 0  Trouble concentrating 0 0 0  Moving slowly or fidgety/restless 0 0 0  Suicidal thoughts 0 0 0  PHQ-9 Score 0 0 0  Difficult doing work/chores Not difficult at all Not difficult at all Not difficult at all    BP Readings from Last 3 Encounters:  09/18/22 118/78  03/20/22 130/84  09/12/21 120/80    Physical Exam Vitals and nursing note reviewed.  HENT:     Head: Normocephalic.     Right Ear: Ear canal and external ear normal.     Left Ear: Ear canal and external ear normal.     Nose: Nose normal. No congestion or rhinorrhea.     Mouth/Throat:     Mouth: Mucous membranes are moist.  Eyes:     General: No scleral  icterus.       Right eye: No discharge.        Left eye: No discharge.     Conjunctiva/sclera: Conjunctivae normal.     Pupils: Pupils are equal, round, and reactive to light.  Neck:     Thyroid: No thyromegaly.     Vascular: No JVD.     Trachea: No tracheal deviation.  Cardiovascular:     Rate and Rhythm: Normal rate and regular rhythm.     Heart sounds: Normal heart sounds. No murmur heard.  No friction rub. No gallop.  Pulmonary:     Effort: No respiratory distress.     Breath sounds: Normal breath sounds. No wheezing, rhonchi or rales.  Abdominal:     General: Bowel sounds are normal.     Palpations: Abdomen is soft. There is no mass.     Tenderness: There is no abdominal tenderness. There is no guarding or rebound.  Genitourinary:    Prostate: Normal. Not enlarged, not tender and no nodules present.     Rectum: Normal. Guaiac result negative.  Musculoskeletal:        General: No tenderness. Normal range of motion.     Cervical back: Normal range of motion and neck supple.  Lymphadenopathy:     Cervical: No cervical adenopathy.  Skin:    General: Skin is warm.     Findings: No rash.  Neurological:     Mental Status: He is alert and oriented to person, place, and time.     Cranial Nerves: No cranial nerve deficit.     Deep Tendon Reflexes: Reflexes are normal and symmetric.     Wt Readings from Last 3 Encounters:  09/18/22 224 lb (101.6 kg)  07/23/22 241 lb (109.3 kg)  03/20/22 241 lb (109.3 kg)    BP 118/78   Pulse 74   Ht 5\' 11"  (1.803 m)   Wt 224 lb (101.6 kg)   SpO2 94%   BMI 31.24 kg/m   Assessment and Plan:  1. Essential hypertension Chronic.  Controlled.  Stable.  Blood pressure today is 118/78.  Continue amlodipine 10 mg once a day.  Will check CMP for electrolytes and GFR.  Will recheck patient in 6 months. - amLODipine (NORVASC) 10 MG tablet; TAKE (1) TABLET BY MOUTH EVERY DAY  Dispense: 90 tablet; Refill: 1 - Comprehensive Metabolic Panel  (CMET)  2. Prediabetes Chronic.  Controlled.  Stable.  In addition to fasting glucose we will check A1c at this time.  Patient's been encouraged to limit concentrated sweets and monitor carbohydrate intake. - HgB A1c - Comprehensive Metabolic Panel (CMET)  3. Hyperlipidemia, unspecified hyperlipidemia type Chronic.  Controlled.  Stable.  Reemphasized low-cholesterol low triglyceride dietary guidelines will check lipid panel current status of LDL. - Lipid Panel With LDL/HDL Ratio  4. Nocturia Other than nocturia patient has no other issues such as urgency decreased stream intermittent stream or hesitancy.  DRE is normal prostate exam with normal size shape consistency.  Will check PSA for current status.  Will recheck in 1 year - PSA  5. Cervical disc disease Chronic.  Controlled with meloxicam 15 mg once a day. - meloxicam (MOBIC) 15 MG tablet; Take 1 tablet (15 mg total) by mouth daily.  Dispense: 90 tablet; Refill: 1    Otilio Miu, MD

## 2022-09-25 DIAGNOSIS — I10 Essential (primary) hypertension: Secondary | ICD-10-CM | POA: Diagnosis not present

## 2022-09-25 DIAGNOSIS — R351 Nocturia: Secondary | ICD-10-CM | POA: Diagnosis not present

## 2022-09-25 DIAGNOSIS — E785 Hyperlipidemia, unspecified: Secondary | ICD-10-CM | POA: Diagnosis not present

## 2022-09-25 DIAGNOSIS — R7303 Prediabetes: Secondary | ICD-10-CM | POA: Diagnosis not present

## 2022-09-26 LAB — COMPREHENSIVE METABOLIC PANEL
ALT: 19 IU/L (ref 0–44)
AST: 18 IU/L (ref 0–40)
Albumin/Globulin Ratio: 1.8 (ref 1.2–2.2)
Albumin: 4.6 g/dL (ref 3.9–4.9)
Alkaline Phosphatase: 94 IU/L (ref 44–121)
BUN/Creatinine Ratio: 24 (ref 10–24)
BUN: 20 mg/dL (ref 8–27)
Bilirubin Total: 0.5 mg/dL (ref 0.0–1.2)
CO2: 23 mmol/L (ref 20–29)
Calcium: 9.6 mg/dL (ref 8.6–10.2)
Chloride: 102 mmol/L (ref 96–106)
Creatinine, Ser: 0.85 mg/dL (ref 0.76–1.27)
Globulin, Total: 2.5 g/dL (ref 1.5–4.5)
Glucose: 105 mg/dL — ABNORMAL HIGH (ref 70–99)
Potassium: 4.8 mmol/L (ref 3.5–5.2)
Sodium: 140 mmol/L (ref 134–144)
Total Protein: 7.1 g/dL (ref 6.0–8.5)
eGFR: 96 mL/min/{1.73_m2} (ref >59)

## 2022-09-26 LAB — LIPID PANEL WITH LDL/HDL RATIO
Cholesterol, Total: 201 mg/dL — ABNORMAL HIGH (ref 100–199)
HDL: 37 mg/dL — ABNORMAL LOW (ref >39)
LDL Chol Calc (NIH): 146 mg/dL — ABNORMAL HIGH (ref 0–99)
LDL/HDL Ratio: 3.9 ratio — ABNORMAL HIGH (ref 0.0–3.6)
Triglycerides: 100 mg/dL (ref 0–149)
VLDL Cholesterol Cal: 18 mg/dL (ref 5–40)

## 2022-09-26 LAB — HEMOGLOBIN A1C
Est. average glucose Bld gHb Est-mCnc: 108 mg/dL
Hgb A1c MFr Bld: 5.4 % (ref 4.8–5.6)

## 2022-09-26 LAB — PSA: Prostate Specific Ag, Serum: 3.7 ng/mL (ref 0.0–4.0)

## 2022-09-28 ENCOUNTER — Other Ambulatory Visit: Payer: Self-pay

## 2022-09-28 DIAGNOSIS — E785 Hyperlipidemia, unspecified: Secondary | ICD-10-CM

## 2022-09-28 MED ORDER — ROSUVASTATIN CALCIUM 5 MG PO TABS: 5.0000 mg | ORAL_TABLET | Freq: Every day | ORAL | 1 refills | Status: DC

## 2022-10-28 ENCOUNTER — Other Ambulatory Visit: Payer: Self-pay | Admitting: Family Medicine

## 2022-10-28 DIAGNOSIS — E785 Hyperlipidemia, unspecified: Secondary | ICD-10-CM

## 2022-10-28 NOTE — Telephone Encounter (Signed)
Requested Prescriptions  Pending Prescriptions Disp Refills   rosuvastatin (CRESTOR) 5 MG tablet [Pharmacy Med Name: ROSUVASTATIN 5MG  TABLETS] 30 tablet 1    Sig: TAKE 1 TABLET(5 MG) BY MOUTH DAILY     Cardiovascular:  Antilipid - Statins 2 Failed - 10/28/2022  7:53 AM      Failed - Lipid Panel in normal range within the last 12 months    Cholesterol, Total  Date Value Ref Range Status  09/25/2022 201 (H) 100 - 199 mg/dL Final   LDL Chol Calc (NIH)  Date Value Ref Range Status  09/25/2022 146 (H) 0 - 99 mg/dL Final   HDL  Date Value Ref Range Status  09/25/2022 37 (L) >39 mg/dL Final   Triglycerides  Date Value Ref Range Status  09/25/2022 100 0 - 149 mg/dL Final         Passed - Cr in normal range and within 360 days    Creatinine  Date Value Ref Range Status  07/26/2014 1.16 0.60 - 1.30 mg/dL Final   Creatinine, Ser  Date Value Ref Range Status  09/25/2022 0.85 0.76 - 1.27 mg/dL Final         Passed - Patient is not pregnant      Passed - Valid encounter within last 12 months    Recent Outpatient Visits           1 month ago Essential hypertension   [facility]   7 months ago Essential hypertension   [facility]   1 year ago Essential hypertension   [facility]   1 year ago Essential hypertension   [facility] [facility]   2 years ago Degeneration, intervertebral disc, lumbar    [facility]       Future Appointments             In 4 months Duanne Limerick, MD [facility] at [facility], [facility]

## 2022-12-09 DIAGNOSIS — K080 Exfoliation of teeth due to systemic causes: Secondary | ICD-10-CM | POA: Diagnosis not present

## 2022-12-14 DIAGNOSIS — K080 Exfoliation of teeth due to systemic causes: Secondary | ICD-10-CM | POA: Diagnosis not present

## 2022-12-18 ENCOUNTER — Other Ambulatory Visit: Payer: Self-pay

## 2022-12-18 ENCOUNTER — Telehealth: Payer: Self-pay | Admitting: Family Medicine

## 2022-12-18 DIAGNOSIS — E785 Hyperlipidemia, unspecified: Secondary | ICD-10-CM

## 2022-12-18 MED ORDER — ROSUVASTATIN CALCIUM 5 MG PO TABS: ORAL_TABLET | ORAL | 0 refills | Status: DC

## 2022-12-18 NOTE — Telephone Encounter (Signed)
The patient called in very frustrated stating he needs a 90 day supply of his rosuvastatin (CRESTOR) 5 MG tablet . He has been without his meds for 2 weeks because he is very tired of having to call in and tell the the office he does not want to drive into [place] every 30 days to get his med refill. Please call in 90 day supplies from now on to   [id number], [place] - [address] AT [facility] [address] & [facility] [address] Phone: [phone]  Fax: [phone]

## 2023-01-25 ENCOUNTER — Other Ambulatory Visit: Payer: Self-pay

## 2023-01-25 DIAGNOSIS — R69 Illness, unspecified: Secondary | ICD-10-CM | POA: Diagnosis not present

## 2023-01-25 DIAGNOSIS — E785 Hyperlipidemia, unspecified: Secondary | ICD-10-CM

## 2023-02-23 ENCOUNTER — Ambulatory Visit (INDEPENDENT_AMBULATORY_CARE_PROVIDER_SITE_OTHER): Payer: Medicare Other | Admitting: Family Medicine

## 2023-02-23 ENCOUNTER — Encounter: Payer: Self-pay | Admitting: Family Medicine

## 2023-02-23 VITALS — BP 120/76 | HR 88 | Temp 98.6°F | Ht 71.0 in | Wt 215.0 lb

## 2023-02-23 DIAGNOSIS — R509 Fever, unspecified: Secondary | ICD-10-CM | POA: Diagnosis not present

## 2023-02-23 MED ORDER — DOXYCYCLINE HYCLATE 100 MG PO TABS: 100.0000 mg | ORAL_TABLET | Freq: Two times a day (BID) | ORAL | 0 refills | Status: DC

## 2023-02-23 NOTE — Progress Notes (Signed)
Date:  02/23/2023   Name:  Nathan Gill   DOB:  February 26, 1956   MRN:  [id number]   Chief Complaint: Generalized Body Aches (Went mountain biking last week and felt back after that. Had chills Friday night- 99 temp., teeth hurting)  Fever  This is a new problem. The current episode started in the past 7 days. The problem occurs intermittently. The problem has been waxing and waning. His temperature was unmeasured prior to arrival. Associated symptoms include muscle aches. Pertinent negatives include no congestion, coughing, diarrhea, ear pain, headaches, nausea, sore throat or wheezing. He has tried acetaminophen and NSAIDs for the symptoms.  Risk factors: no sick contacts   Risk factors comment:  Tick exposure   Lab Results  Component Value Date   NA 140 09/25/2022   K 4.8 09/25/2022   CO2 23 09/25/2022   GLUCOSE 105 (H) 09/25/2022   BUN 20 09/25/2022   CREATININE 0.85 09/25/2022   CALCIUM 9.6 09/25/2022   EGFR 96 09/25/2022   GFRNONAA >60 08/01/2021   Lab Results  Component Value Date   CHOL 127 01/25/2023   HDL 42 01/25/2023   LDLCALC 65 01/25/2023   TRIG 107 01/25/2023   No results found for: "TSH" Lab Results  Component Value Date   HGBA1C 5.4 09/25/2022   Lab Results  Component Value Date   WBC 5.4 08/01/2021   HGB 14.9 08/01/2021   HCT 43.1 08/01/2021   MCV 81.6 08/01/2021   PLT 200 08/01/2021   Lab Results  Component Value Date   ALT 19 01/25/2023   AST 15 01/25/2023   ALKPHOS 91 01/25/2023   BILITOT 0.5 01/25/2023   No results found for: "25OHVITD2", "25OHVITD3", "VD25OH"   Review of Systems  Constitutional:  Positive for chills, diaphoresis, fatigue and fever.  HENT:  Negative for congestion, ear pain, sinus pressure, sinus pain and sore throat.   Respiratory:  Negative for cough, shortness of breath and wheezing.   Cardiovascular:  Negative for leg swelling.  Gastrointestinal:  Negative for diarrhea and nausea.  Neurological:  Negative for  headaches.    Patient Active Problem List   Diagnosis Date Noted   Colon cancer screening    Polyp of ascending colon    Healthcare maintenance 06/30/2019   Essential hypertension 09/23/2016   Bilateral carpal tunnel syndrome 09/23/2016   Cervical disc disease 09/23/2016   Epicondylitis, lateral, left 09/23/2016   Gastroesophageal reflux disease without esophagitis 09/23/2016   Large cell lymphoma, extranodal and solid organ sites ([facility]) 01/28/2016    No Known Allergies  Past Surgical History:  Procedure Laterality Date   COLONOSCOPY WITH PROPOFOL N/A 04/25/2021   Procedure: COLONOSCOPY WITH BIOPSY;  Surgeon: Midge Minium, MD;  Location[facility];  Service: Endoscopy;  Laterality: N/A;   EXTERNAL EAR SURGERY     KNEE ARTHROSCOPY     POLYPECTOMY N/A 04/25/2021   Procedure: POLYPECTOMY;  Surgeon: Midge Minium, MD;  Location: [facility];  Service: Endoscopy;  Laterality: N/A;   TESTICLE SURGERY      Social History   Tobacco Use   Smoking status: Never   Smokeless tobacco: Never  Vaping Use   Vaping status: Never Used  Substance Use Topics   Alcohol use: Yes    Comment: average 2 cans a month at most   Drug use: No     Medication list has been reviewed and updated.  Current Meds  Medication Sig   acetaminophen (TYLENOL) 325 MG tablet Take 325  mg by mouth every 6 (six) hours as needed.   amLODipine (NORVASC) 10 MG tablet TAKE (1) TABLET BY MOUTH EVERY DAY   meloxicam (MOBIC) 15 MG tablet Take 1 tablet (15 mg total) by mouth daily.   omeprazole (PRILOSEC) 20 MG capsule Take 1 capsule (20 mg total) by mouth daily. otc   rosuvastatin (CRESTOR) 5 MG tablet TAKE 1 TABLET(5 MG) BY MOUTH DAILY       02/23/2023    1:13 PM 09/18/2022   10:25 AM 03/20/2022    9:54 AM 09/12/2021    8:52 AM  GAD 7 : Generalized Anxiety Score  Nervous, Anxious, on Edge 0 0 0 0  Control/stop worrying 0 0 0 0  Worry too much - different things 0 0 0 0  Trouble relaxing 0 0 0 0   Restless 0 0 0 0  Easily annoyed or irritable 0 0 0 0  Afraid - awful might happen 0 0 0 0  Total GAD 7 Score 0 0 0 0  Anxiety Difficulty Not difficult at all Not difficult at all Not difficult at all Not difficult at all       02/23/2023    1:13 PM 09/18/2022   10:24 AM 07/23/2022    8:18 AM  Depression screen PHQ 2/9  Decreased Interest 0 0 0  Down, Depressed, Hopeless 0 0 0  PHQ - 2 Score 0 0 0  Altered sleeping 0 0 0  Tired, decreased energy 0 0 0  Change in appetite 0 0 0  Feeling bad or failure about yourself  0 0 0  Trouble concentrating 0 0 0  Moving slowly or fidgety/restless 0 0 0  Suicidal thoughts 0 0 0  PHQ-9 Score 0 0 0  Difficult doing work/chores Not difficult at all Not difficult at all Not difficult at all    BP Readings from Last 3 Encounters:  02/23/23 120/76  09/18/22 118/78  03/20/22 130/84    Physical Exam Vitals and nursing note reviewed.  HENT:     Right Ear: Tympanic membrane, ear canal and external ear normal.     Left Ear: Tympanic membrane, ear canal and external ear normal.     Nose: Nose normal. No congestion or rhinorrhea.     Mouth/Throat:     Mouth: Mucous membranes are moist.  Eyes:     Conjunctiva/sclera: Conjunctivae normal.  Cardiovascular:     Rate and Rhythm: Normal rate.     Heart sounds: No murmur heard.    No friction rub. No gallop.  Pulmonary:     Breath sounds: No wheezing, rhonchi or rales.  Abdominal:     Tenderness: There is no abdominal tenderness.  Musculoskeletal:     Cervical back: Normal range of motion.     Wt Readings from Last 3 Encounters:  02/23/23 215 lb (97.5 kg)  09/18/22 224 lb (101.6 kg)  07/23/22 241 lb (109.3 kg)    BP 120/76   Pulse 88   Temp 98.6 F (37 C) (Oral)   Ht 5\' 11"  (1.803 m)   Wt 215 lb (97.5 kg)   SpO2 98%   BMI 29.99 kg/m   Assessment and Plan:  1. Fever and chills New onset.  Episodic.  Primarily myalgia driven patient has removed 2 ticks of note and does mountain  biking.  Patient has had a low-grade fever at home with today's at 98.7.  We will check Lyme titer with CBC and initiate doxycycline 100 mg twice a day.  Patient is to consider going to ER setting if symptomatology continues or worsens. - Lyme Disease Serology w/Reflex - CBC with Differential/Platelet - doxycycline (VIBRA-TABS) 100 MG tablet; Take 1 tablet (100 mg total) by mouth 2 (two) times daily.  Dispense: 20 tablet; Refill: 0    Elizabeth Sauer, MD

## 2023-02-25 LAB — LYME DISEASE SEROLOGY W/REFLEX: Lyme Total Antibody EIA: NEGATIVE

## 2023-02-25 LAB — CBC WITH DIFFERENTIAL/PLATELET
Basophils Absolute: 0 10*3/uL (ref 0.0–0.2)
Basos: 0 %
EOS (ABSOLUTE): 0.1 10*3/uL (ref 0.0–0.4)
Eos: 1 %
Hematocrit: 42.1 % (ref 37.5–51.0)
Hemoglobin: 14.2 g/dL (ref 13.0–17.7)
Immature Grans (Abs): 0.1 10*3/uL (ref 0.0–0.1)
Immature Granulocytes: 1 %
Lymphocytes Absolute: 1.2 10*3/uL (ref 0.7–3.1)
Lymphs: 15 %
MCH: 28.6 pg (ref 26.6–33.0)
MCHC: 33.7 g/dL (ref 31.5–35.7)
MCV: 85 fL (ref 79–97)
Monocytes Absolute: 0.7 10*3/uL (ref 0.1–0.9)
Monocytes: 8 %
Neutrophils Absolute: 5.9 10*3/uL (ref 1.4–7.0)
Neutrophils: 75 %
Platelets: 273 10*3/uL (ref 150–450)
RBC: 4.97 x10E6/uL (ref 4.14–5.80)
RDW: 13.7 % (ref 11.6–15.4)
WBC: 7.9 10*3/uL (ref 3.4–10.8)

## 2023-02-25 LAB — SPECIMEN STATUS REPORT

## 2023-02-26 ENCOUNTER — Encounter: Payer: Self-pay | Admitting: Family Medicine

## 2023-03-15 ENCOUNTER — Other Ambulatory Visit: Payer: Self-pay | Admitting: Family Medicine

## 2023-03-15 DIAGNOSIS — I10 Essential (primary) hypertension: Secondary | ICD-10-CM

## 2023-03-15 DIAGNOSIS — M5090 Cervical disc disorder, unspecified, unspecified cervical region: Secondary | ICD-10-CM

## 2023-03-22 ENCOUNTER — Ambulatory Visit: Payer: Medicare Other | Admitting: Family Medicine

## 2023-03-22 DIAGNOSIS — D2271 Melanocytic nevi of right lower limb, including hip: Secondary | ICD-10-CM | POA: Diagnosis not present

## 2023-03-22 DIAGNOSIS — D2272 Melanocytic nevi of left lower limb, including hip: Secondary | ICD-10-CM | POA: Diagnosis not present

## 2023-03-22 DIAGNOSIS — L570 Actinic keratosis: Secondary | ICD-10-CM | POA: Diagnosis not present

## 2023-03-22 DIAGNOSIS — D2262 Melanocytic nevi of left upper limb, including shoulder: Secondary | ICD-10-CM | POA: Diagnosis not present

## 2023-03-22 DIAGNOSIS — Z85828 Personal history of other malignant neoplasm of skin: Secondary | ICD-10-CM | POA: Diagnosis not present

## 2023-03-30 ENCOUNTER — Ambulatory Visit (INDEPENDENT_AMBULATORY_CARE_PROVIDER_SITE_OTHER): Payer: Medicare Other | Admitting: Family Medicine

## 2023-03-30 ENCOUNTER — Encounter: Payer: Self-pay | Admitting: Family Medicine

## 2023-03-30 VITALS — BP 112/84 | HR 78 | Ht 71.0 in | Wt 217.0 lb

## 2023-03-30 DIAGNOSIS — I10 Essential (primary) hypertension: Secondary | ICD-10-CM | POA: Diagnosis not present

## 2023-03-30 DIAGNOSIS — E785 Hyperlipidemia, unspecified: Secondary | ICD-10-CM

## 2023-03-30 DIAGNOSIS — K219 Gastro-esophageal reflux disease without esophagitis: Secondary | ICD-10-CM

## 2023-03-30 MED ORDER — ROSUVASTATIN CALCIUM 5 MG PO TABS
ORAL_TABLET | ORAL | 1 refills | Status: DC
Start: 2023-03-30 — End: 2023-04-25

## 2023-03-30 MED ORDER — OMEPRAZOLE 20 MG PO CPDR
20.0000 mg | DELAYED_RELEASE_CAPSULE | Freq: Every day | ORAL | Status: DC
Start: 2023-03-30 — End: 2023-10-27

## 2023-03-30 MED ORDER — AMLODIPINE BESYLATE 10 MG PO TABS
ORAL_TABLET | ORAL | 1 refills | Status: DC
Start: 2023-03-30 — End: 2023-09-20

## 2023-03-30 NOTE — Progress Notes (Signed)
Date:  03/30/2023   Name:  Nathan Gill   DOB:  [birth year]   MRN:  [id number]   Chief Complaint: Hypertension, Gastroesophageal Reflux, and Hyperlipidemia  Hypertension This is a chronic problem. The current episode started more than 1 year ago. The problem has been gradually improving since onset. The problem is controlled. Pertinent negatives include no anxiety, blurred vision, chest pain, headaches, malaise/fatigue, neck pain, orthopnea, palpitations, peripheral edema, PND, shortness of breath or sweats. Risk factors for coronary artery disease include dyslipidemia. Past treatments include ACE inhibitors. The current treatment provides moderate improvement. There is no history of chronic renal disease.  Gastroesophageal Reflux He reports no abdominal pain, no chest pain, no coughing, no heartburn, no nausea, no sore throat or no wheezing. This is a chronic problem. The current episode started more than 1 year ago. The problem occurs occasionally. The problem has been gradually improving. The symptoms are aggravated by certain foods. Pertinent negatives include no anemia, fatigue, melena, muscle weakness, orthopnea or weight loss. He has tried a PPI for the symptoms. The treatment provided moderate relief.  Hyperlipidemia This is a chronic problem. The current episode started more than 1 year ago. The problem is controlled. Recent lipid tests were reviewed and are normal. He has no history of chronic renal disease, diabetes, hypothyroidism, liver disease, obesity or nephrotic syndrome. Pertinent negatives include no chest pain, myalgias or shortness of breath. Current antihyperlipidemic treatment includes statins. The current treatment provides moderate improvement of lipids. There are no compliance problems.  Risk factors for coronary artery disease include hypertension.    Lab Results  Component Value Date   NA 140 09/25/2022   K 4.8 09/25/2022   CO2 23 09/25/2022   GLUCOSE 105 (H)  09/25/2022   BUN 20 09/25/2022   CREATININE 0.85 09/25/2022   CALCIUM 9.6 09/25/2022   EGFR 96 09/25/2022   GFRNONAA >60 08/01/2021   Lab Results  Component Value Date   CHOL 127 01/25/2023   HDL 42 01/25/2023   LDLCALC 65 01/25/2023   TRIG 107 01/25/2023   No results found for: "TSH" Lab Results  Component Value Date   HGBA1C 5.4 09/25/2022   Lab Results  Component Value Date   WBC 7.9 02/23/2023   HGB 14.2 02/23/2023   HCT 42.1 02/23/2023   MCV 85 02/23/2023   PLT 273 02/23/2023   Lab Results  Component Value Date   ALT 19 01/25/2023   AST 15 01/25/2023   ALKPHOS 91 01/25/2023   BILITOT 0.5 01/25/2023   No results found for: "25OHVITD2", "25OHVITD3", "VD25OH"   Review of Systems  Constitutional:  Negative for chills, fatigue, fever, malaise/fatigue and weight loss.  HENT:  Negative for drooling, ear discharge, ear pain and sore throat.   Eyes:  Negative for blurred vision.  Respiratory:  Negative for cough, shortness of breath and wheezing.   Cardiovascular:  Negative for chest pain, palpitations, orthopnea, leg swelling and PND.  Gastrointestinal:  Positive for constipation. Negative for abdominal pain, blood in stool, diarrhea, heartburn, melena and nausea.  Endocrine: Negative for polydipsia and polyuria.  Genitourinary:  Negative for dysuria, frequency, hematuria and urgency.  Musculoskeletal:  Negative for back pain, myalgias, muscle weakness and neck pain.  Skin:  Negative for rash.  Allergic/Immunologic: Negative for environmental allergies.  Neurological:  Negative for dizziness and headaches.  Hematological:  Does not bruise/bleed easily.  Psychiatric/Behavioral:  Negative for suicidal ideas. The patient is not nervous/anxious.     Patient Active Problem  List   Diagnosis Date Noted   Colon cancer screening    Polyp of ascending colon    Healthcare maintenance 06/30/2019   Essential hypertension 09/23/2016   Bilateral carpal tunnel syndrome  09/23/2016   Cervical disc disease 09/23/2016   Epicondylitis, lateral, left 09/23/2016   Gastroesophageal reflux disease without esophagitis 09/23/2016   Large cell lymphoma, extranodal and solid organ sites (HCC) 01/28/2016    No Known Allergies  Past Surgical History:  Procedure Laterality Date   COLONOSCOPY WITH PROPOFOL N/A 04/25/2021   Procedure: COLONOSCOPY WITH BIOPSY;  Surgeon: Midge Minium, MD;  Location: [facility];  Service: Endoscopy;  Laterality: N/A;   EXTERNAL EAR SURGERY     KNEE ARTHROSCOPY     POLYPECTOMY N/A 04/25/2021   Procedure: POLYPECTOMY;  Surgeon: Midge Minium, MD;  Location: [facility];  Service: Endoscopy;  Laterality: N/A;   TESTICLE SURGERY      Social History   Tobacco Use   Smoking status: Never   Smokeless tobacco: Never  Vaping Use   Vaping status: Never Used  Substance Use Topics   Alcohol use: Yes    Comment: average 2 cans a month at most   Drug use: No     Medication list has been reviewed and updated.  Current Meds  Medication Sig   acetaminophen (TYLENOL) 325 MG tablet Take 325 mg by mouth every 6 (six) hours as needed.   amLODipine (NORVASC) 10 MG tablet TAKE 1 TABLET BY MOUTH EVERY DAY   meloxicam (MOBIC) 15 MG tablet TAKE 1 TABLET(15 MG) BY MOUTH DAILY   omeprazole (PRILOSEC) 20 MG capsule Take 1 capsule (20 mg total) by mouth daily. otc   rosuvastatin (CRESTOR) 5 MG tablet TAKE 1 TABLET(5 MG) BY MOUTH DAILY   [DISCONTINUED] doxycycline (VIBRA-TABS) 100 MG tablet Take 1 tablet (100 mg total) by mouth 2 (two) times daily.       02/23/2023    1:13 PM 09/18/2022   10:25 AM 03/20/2022    9:54 AM 09/12/2021    8:52 AM  GAD 7 : Generalized Anxiety Score  Nervous, Anxious, on Edge 0 0 0 0  Control/stop worrying 0 0 0 0  Worry too much - different things 0 0 0 0  Trouble relaxing 0 0 0 0  Restless 0 0 0 0  Easily annoyed or irritable 0 0 0 0  Afraid - awful might happen 0 0 0 0  Total GAD 7 Score 0 0 0 0   Anxiety Difficulty Not difficult at all Not difficult at all Not difficult at all Not difficult at all       02/23/2023    1:13 PM 09/18/2022   10:24 AM 07/23/2022    8:18 AM  Depression screen PHQ 2/9  Decreased Interest 0 0 0  Down, Depressed, Hopeless 0 0 0  PHQ - 2 Score 0 0 0  Altered sleeping 0 0 0  Tired, decreased energy 0 0 0  Change in appetite 0 0 0  Feeling bad or failure about yourself  0 0 0  Trouble concentrating 0 0 0  Moving slowly or fidgety/restless 0 0 0  Suicidal thoughts 0 0 0  PHQ-9 Score 0 0 0  Difficult doing work/chores Not difficult at all Not difficult at all Not difficult at all    BP Readings from Last 3 Encounters:  03/30/23 112/84  02/23/23 120/76  09/18/22 118/78    Physical Exam Vitals and nursing note reviewed.  HENT:  Head: Normocephalic.     Right Ear: Tympanic membrane and external ear normal.     Left Ear: Tympanic membrane and external ear normal.     Nose: Nose normal. No congestion or rhinorrhea.     Mouth/Throat:     Mouth: Mucous membranes are moist.  Eyes:     General: No scleral icterus.       Right eye: No discharge.        Left eye: No discharge.     Conjunctiva/sclera: Conjunctivae normal.     Pupils: Pupils are equal, round, and reactive to light.  Neck:     Thyroid: No thyromegaly.     Vascular: No JVD.     Trachea: No tracheal deviation.  Cardiovascular:     Rate and Rhythm: Normal rate and regular rhythm.     Heart sounds: Normal heart sounds. No murmur heard.    No friction rub. No gallop.  Pulmonary:     Effort: No respiratory distress.     Breath sounds: Normal breath sounds. No wheezing, rhonchi or rales.  Abdominal:     General: Bowel sounds are normal. There is no distension.     Palpations: Abdomen is soft. There is no mass.     Tenderness: There is no abdominal tenderness. There is no guarding or rebound.     Hernia: No hernia is present.  Musculoskeletal:     Cervical back: Normal range of  motion and neck supple.  Lymphadenopathy:     Cervical: No cervical adenopathy.  Skin:    General: Skin is warm.  Neurological:     Mental Status: He is alert.     Wt Readings from Last 3 Encounters:  03/30/23 217 lb (98.4 kg)  02/23/23 215 lb (97.5 kg)  09/18/22 224 lb (101.6 kg)    BP 112/84   Pulse 78   Ht 5\' 11"  (1.803 m)   Wt 217 lb (98.4 kg)   SpO2 98%   BMI 30.27 kg/m   Assessment and Plan: 1. Essential hypertension Chronic.  Controlled.  Stable.  Blood pressure today is 112/84.  Asymptomatic.  Tolerating medication well.  Continue amlodipine 10 mg once a day.  Will recheck patient in 6 months for blood pressure recheck - amLODipine (NORVASC) 10 MG tablet; TAKE 1 TABLET BY MOUTH EVERY DAY  Dispense: 90 tablet; Refill: 1  2. Gastroesophageal reflux disease without esophagitis Chronic.  Controlled.  Stable.  Continue omeprazole 20 mg once a day. - omeprazole (PRILOSEC) 20 MG capsule; Take 1 capsule (20 mg total) by mouth daily. otc  3. Hyperlipidemia, unspecified hyperlipidemia type Chronic.  Controlled.  Stable.  Asymptomatic.  Without myalgias.  Tolerating rosuvastatin 5 mg once a day.  Will recheck in 6 months. - rosuvastatin (CRESTOR) 5 MG tablet; TAKE 1 TABLET(5 MG) BY MOUTH DAILY  Dispense: 90 tablet; Refill: 1     Elizabeth Sauer, MD

## 2023-04-25 ENCOUNTER — Other Ambulatory Visit: Payer: Self-pay | Admitting: Family Medicine

## 2023-04-25 DIAGNOSIS — E785 Hyperlipidemia, unspecified: Secondary | ICD-10-CM

## 2023-05-18 ENCOUNTER — Ambulatory Visit (INDEPENDENT_AMBULATORY_CARE_PROVIDER_SITE_OTHER): Payer: Medicare Other | Admitting: Family Medicine

## 2023-05-18 ENCOUNTER — Encounter: Payer: Self-pay | Admitting: Family Medicine

## 2023-05-18 VITALS — BP 136/82 | HR 71 | Ht 71.0 in | Wt 221.0 lb

## 2023-05-18 DIAGNOSIS — R29898 Other symptoms and signs involving the musculoskeletal system: Secondary | ICD-10-CM | POA: Diagnosis not present

## 2023-05-18 DIAGNOSIS — M654 Radial styloid tenosynovitis [de Quervain]: Secondary | ICD-10-CM | POA: Diagnosis not present

## 2023-05-18 DIAGNOSIS — M720 Palmar fascial fibromatosis [Dupuytren]: Secondary | ICD-10-CM

## 2023-05-18 DIAGNOSIS — M5416 Radiculopathy, lumbar region: Secondary | ICD-10-CM

## 2023-05-18 NOTE — Progress Notes (Signed)
Date:  05/18/2023   Name:  Nathan Gill   DOB:  10-Apr-1956   MRN:  [id number]   Chief Complaint: Hand Pain (Right hand, has a knot on hand, getting bigger) and Extremity Weakness (X2 weeks, right leg, weakness, aching)  Hand Pain  The incident occurred more than 1 week ago. There was no injury mechanism. The pain is present in the right hand. The quality of the pain is described as aching. The pain does not radiate. The pain is mild. The pain has been Constant since the incident. Pertinent negatives include no chest pain, numbness or tingling.  Extremity Weakness  Pertinent negatives include no numbness or tingling.    Lab Results  Component Value Date   NA 140 09/25/2022   K 4.8 09/25/2022   CO2 23 09/25/2022   GLUCOSE 105 (H) 09/25/2022   BUN 20 09/25/2022   CREATININE 0.85 09/25/2022   CALCIUM 9.6 09/25/2022   EGFR 96 09/25/2022   GFRNONAA >60 08/01/2021   Lab Results  Component Value Date   CHOL 127 01/25/2023   HDL 42 01/25/2023   LDLCALC 65 01/25/2023   TRIG 107 01/25/2023   No results found for: "TSH" Lab Results  Component Value Date   HGBA1C 5.4 09/25/2022   Lab Results  Component Value Date   WBC 7.9 02/23/2023   HGB 14.2 02/23/2023   HCT 42.1 02/23/2023   MCV 85 02/23/2023   PLT 273 02/23/2023   Lab Results  Component Value Date   ALT 19 01/25/2023   AST 15 01/25/2023   ALKPHOS 91 01/25/2023   BILITOT 0.5 01/25/2023   No results found for: "25OHVITD2", "25OHVITD3", "VD25OH"   Review of Systems  Respiratory:  Negative for chest tightness, shortness of breath and wheezing.   Cardiovascular:  Negative for chest pain and palpitations.  Gastrointestinal:  Negative for abdominal pain and blood in stool.  Endocrine: Negative for polydipsia and polyuria.  Genitourinary:  Negative for difficulty urinating and hematuria.  Musculoskeletal:  Positive for arthralgias, back pain, extremity weakness and myalgias. Negative for neck stiffness.  Neurological:   Negative for tingling and numbness.    Patient Active Problem List   Diagnosis Date Noted   Colon cancer screening    Polyp of ascending colon    Healthcare maintenance 06/30/2019   Essential hypertension 09/23/2016   Bilateral carpal tunnel syndrome 09/23/2016   Cervical disc disease 09/23/2016   Epicondylitis, lateral, left 09/23/2016   Gastroesophageal reflux disease without esophagitis 09/23/2016   Large cell lymphoma, extranodal and solid organ sites (HCC) 01/28/2016    No Known Allergies  Past Surgical History:  Procedure Laterality Date   COLONOSCOPY WITH PROPOFOL N/A 04/25/2021   Procedure: COLONOSCOPY WITH BIOPSY;  Surgeon: Midge Minium, MD;  Location: [facility];  Service: Endoscopy;  Laterality: N/A;   EXTERNAL EAR SURGERY     KNEE ARTHROSCOPY     POLYPECTOMY N/A 04/25/2021   Procedure: POLYPECTOMY;  Surgeon: Midge Minium, MD;  Location: [facility];  Service: Endoscopy;  Laterality: N/A;   TESTICLE SURGERY      Social History   Tobacco Use   Smoking status: Never   Smokeless tobacco: Never  Vaping Use   Vaping status: Never Used  Substance Use Topics   Alcohol use: Yes    Comment: average 2 cans a month at most   Drug use: No     Medication list has been reviewed and updated.  Current Meds  Medication Sig  acetaminophen (TYLENOL) 325 MG tablet Take 325 mg by mouth every 6 (six) hours as needed.   amLODipine (NORVASC) 10 MG tablet TAKE 1 TABLET BY MOUTH EVERY DAY   meloxicam (MOBIC) 15 MG tablet TAKE 1 TABLET(15 MG) BY MOUTH DAILY   omeprazole (PRILOSEC) 20 MG capsule Take 1 capsule (20 mg total) by mouth daily. otc   rosuvastatin (CRESTOR) 5 MG tablet TAKE 1 TABLET(5 MG) BY MOUTH DAILY       05/18/2023    8:33 AM 02/23/2023    1:13 PM 09/18/2022   10:25 AM 03/20/2022    9:54 AM  GAD 7 : Generalized Anxiety Score  Nervous, Anxious, on Edge 0 0 0 0  Control/stop worrying 0 0 0 0  Worry too much - different things 0 0 0 0  Trouble  relaxing 0 0 0 0  Restless 0 0 0 0  Easily annoyed or irritable 0 0 0 0  Afraid - awful might happen 0 0 0 0  Total GAD 7 Score 0 0 0 0  Anxiety Difficulty Not difficult at all Not difficult at all Not difficult at all Not difficult at all       05/18/2023    8:32 AM 02/23/2023    1:13 PM 09/18/2022   10:24 AM  Depression screen PHQ 2/9  Decreased Interest 0 0 0  Down, Depressed, Hopeless 0 0 0  PHQ - 2 Score 0 0 0  Altered sleeping 0 0 0  Tired, decreased energy 0 0 0  Change in appetite 0 0 0  Feeling bad or failure about yourself  0 0 0  Trouble concentrating 0 0 0  Moving slowly or fidgety/restless 0 0 0  Suicidal thoughts 0 0 0  PHQ-9 Score 0 0 0  Difficult doing work/chores Not difficult at all Not difficult at all Not difficult at all    BP Readings from Last 3 Encounters:  05/18/23 136/82  03/30/23 112/84  02/23/23 120/76    Physical Exam Vitals and nursing note reviewed.  HENT:     Head: Normocephalic.     Right Ear: External ear normal.     Left Ear: External ear normal.     Nose: Nose normal.  Eyes:     General: No scleral icterus.       Right eye: No discharge.        Left eye: No discharge.     Conjunctiva/sclera: Conjunctivae normal.     Pupils: Pupils are equal, round, and reactive to light.  Neck:     Thyroid: No thyromegaly.     Vascular: No JVD.     Trachea: No tracheal deviation.  Cardiovascular:     Rate and Rhythm: Normal rate and regular rhythm.     Heart sounds: Normal heart sounds. No murmur heard.    No friction rub. No gallop.  Pulmonary:     Effort: No respiratory distress.     Breath sounds: Normal breath sounds. No wheezing or rales.  Abdominal:     General: Bowel sounds are normal.     Palpations: Abdomen is soft. There is no mass.     Tenderness: There is no abdominal tenderness. There is no guarding or rebound.  Musculoskeletal:        General: No tenderness. Normal range of motion.     Cervical back: Normal range of motion  and neck supple.     Lumbar back: No spasms or tenderness. Normal range of motion.  Comments: Palpable contracture ring finger/positive Finkelstein sign  Lymphadenopathy:     Cervical: No cervical adenopathy.  Skin:    General: Skin is warm.     Findings: No rash.  Neurological:     Mental Status: He is alert and oriented to person, place, and time.     Cranial Nerves: No cranial nerve deficit.     Deep Tendon Reflexes: Reflexes are normal and symmetric.     Wt Readings from Last 3 Encounters:  05/18/23 221 lb (100.2 kg)  03/30/23 217 lb (98.4 kg)  02/23/23 215 lb (97.5 kg)    BP 136/82   Pulse 71   Ht 5\' 11"  (1.803 m)   Wt 221 lb (100.2 kg)   SpO2 98%   BMI 30.82 kg/m   Assessment and Plan: 1. Dupuytren contracture of right hand Chronic.  Persistent.  Progressing.  Patient is having more difficulty and extension of the right ring finger.  Exam and history is consistent with Dupuytren contracture and we will refer to orthopedic for perhaps hand referral. - Ambulatory referral to Orthopedic Surgery  2. De Quervain's disease (tenosynovitis) Chronic.  Persistent.  Progressing.  There is tenderness with extension of the Palacos longus.  This is tenderness with passive stretch Lourena Simmonds.  Will refer to orthopedics for evaluation - Ambulatory referral to Orthopedic Surgery  3. Weakness of right lower extremity Chronic.  Uncontrolled.  Stable.  Patient is progressing with more weakness with prolonged activity and climbing such as hiking up mountains and cycling.  Patient has weakness in the right lower extremity with progressive development and frequency patient does have a history of degenerative disc disease and I have a feeling that this is a manifestation of radiculopathy and we will refer to Dedra Skeens who has seen him for this in the past.  4. Lumbar radiculopathy Patient has been followed by lumbar radiculopathy in the past for degenerative disc disease and will return  for reevaluation and possible further evaluation requiring injections. - Ambulatory referral to Orthopedic Surgery     Elizabeth Sauer, MD

## 2023-06-02 DIAGNOSIS — M720 Palmar fascial fibromatosis [Dupuytren]: Secondary | ICD-10-CM | POA: Diagnosis not present

## 2023-06-02 DIAGNOSIS — G5601 Carpal tunnel syndrome, right upper limb: Secondary | ICD-10-CM | POA: Diagnosis not present

## 2023-06-02 DIAGNOSIS — M79641 Pain in right hand: Secondary | ICD-10-CM | POA: Diagnosis not present

## 2023-06-07 ENCOUNTER — Encounter: Payer: Self-pay | Admitting: Pharmacist

## 2023-06-07 NOTE — Progress Notes (Signed)
   06/07/2023  Patient ID: Nathan Gill, male   DOB: [birth year], 67 y.o.   MRN: [id number]  Pharmacy Quality Measure Review  This patient is appearing on a report for being at risk of failing the adherence measure for cholesterol (statin) medications this calendar year.   Medication: rosuvastatin 5 mg Last fill date: 05/31/2023 for 90 day supply  Insurance report was not up to date. No action needed at this time.   Estelle Grumbles, PharmD, [facility] [phone]

## 2023-07-05 DIAGNOSIS — M5442 Lumbago with sciatica, left side: Secondary | ICD-10-CM | POA: Diagnosis not present

## 2023-07-05 DIAGNOSIS — E782 Mixed hyperlipidemia: Secondary | ICD-10-CM | POA: Diagnosis not present

## 2023-07-05 DIAGNOSIS — Z0000 Encounter for general adult medical examination without abnormal findings: Secondary | ICD-10-CM | POA: Diagnosis not present

## 2023-07-05 DIAGNOSIS — I10 Essential (primary) hypertension: Secondary | ICD-10-CM | POA: Diagnosis not present

## 2023-09-17 ENCOUNTER — Other Ambulatory Visit: Payer: Self-pay | Admitting: Family Medicine

## 2023-09-17 DIAGNOSIS — M5090 Cervical disc disorder, unspecified, unspecified cervical region: Secondary | ICD-10-CM

## 2023-09-20 ENCOUNTER — Other Ambulatory Visit: Payer: Self-pay | Admitting: Family Medicine

## 2023-09-20 DIAGNOSIS — I10 Essential (primary) hypertension: Secondary | ICD-10-CM

## 2023-10-13 ENCOUNTER — Other Ambulatory Visit: Payer: Self-pay | Admitting: Surgery

## 2023-10-21 ENCOUNTER — Other Ambulatory Visit: Payer: Self-pay

## 2023-10-21 ENCOUNTER — Encounter
Admission: RE | Admit: 2023-10-21 | Discharge: 2023-10-21 | Disposition: A | Discharge status: Home / Self Care | Source: Ambulatory Visit | Attending: Surgery | Admitting: Surgery

## 2023-10-21 DIAGNOSIS — I10 Essential (primary) hypertension: Secondary | ICD-10-CM

## 2023-10-21 HISTORY — DX: Prediabetes: R73.03

## 2023-10-21 HISTORY — DX: Pneumonia, unspecified organism: J18.9

## 2023-10-21 HISTORY — DX: Personal history of urinary calculi: Z87.442

## 2023-10-21 HISTORY — DX: Carpal tunnel syndrome, right upper limb: G56.01

## 2023-10-21 NOTE — Patient Instructions (Addendum)
 Your procedure is scheduled on: 10/27/23 - Wednesday Report to the Registration Desk on the 1st floor of the [facility]. To find out your arrival time, please call [phone] between 1PM - 3PM on: 10/26/23 - Tuesday If your arrival time is 6:00 am, do not arrive before that time as the [facility] entrance doors do not open until 6:00 am.  REMEMBER: Instructions that are not followed completely may result in serious medical risk, up to and including death; or upon the discretion of your surgeon and anesthesiologist your surgery may need to be rescheduled.  Do not eat food after midnight the night before surgery.  No gum chewing or hard candies.  You may however, drink CLEAR liquids up to 2 hours before you are scheduled to arrive for your surgery. Do not drink anything within 2 hours of your scheduled arrival time.  Clear liquids include: - water   - apple juice without pulp - gatorade (not RED colors) - black coffee or tea (Do NOT add milk or creamers to the coffee or tea) Do NOT drink anything that is not on this list.  In addition, your doctor has ordered for you to drink the provided:  Ensure Pre-Surgery Clear Carbohydrate Drink  Drinking this carbohydrate drink up to two hours before surgery helps to reduce insulin resistance and improve patient outcomes. Please complete drinking 2 hours before scheduled arrival time.  One week prior to surgery: Stop Anti-inflammatories (NSAIDS) such as meloxicam  (MOBIC ) , Advil, Aleve, Ibuprofen, Motrin, Naproxen, Naprosyn and Aspirin based products such as Excedrin, Goody's Powder, BC Powder. You may take Tylenol  if needed for pain up until the day of surgery.  Stop ANY OVER THE COUNTER supplements until after surgery.  ON THE DAY OF SURGERY ONLY TAKE THESE MEDICATIONS WITH SIPS OF WATER :  amLODipine  (NORVASC ) 10 MG tablet  omeprazole  (PRILOSEC)    No Alcohol for 24 hours before or after surgery.  No Smoking including e-cigarettes  for 24 hours before surgery.  No chewable tobacco products for at least 6 hours before surgery.  No nicotine patches on the day of surgery.  Do not use any "recreational" drugs for at least a week (preferably 2 weeks) before your surgery.  Please be advised that the combination of cocaine and anesthesia may have negative outcomes, up to and including death. If you test positive for cocaine, your surgery will be cancelled.  On the morning of surgery brush your teeth with toothpaste and water , you may rinse your mouth with mouthwash if you wish. Do not swallow any toothpaste or mouthwash.  Use CHG Soap or wipes as directed on instruction sheet.  Do not wear jewelry, make-up, hairpins, clips or nail polish.  For welded (permanent) jewelry: bracelets, anklets, waist bands, etc.  Please have this removed prior to surgery.  If it is not removed, there is a chance that hospital personnel will need to cut it off on the day of surgery.  Do not wear lotions, powders, or perfumes.   Do not shave body hair from the neck down 48 hours before surgery.  Contact lenses, hearing aids and dentures may not be worn into surgery.  Do not bring valuables to the hospital. [facility] is not responsible for any missing/lost belongings or valuables.   Notify your doctor if there is any change in your medical condition (cold, fever, infection).  Wear comfortable clothing (specific to your surgery type) to the hospital.  After surgery, you can help prevent lung complications by doing  breathing exercises.  Take deep breaths and cough every 1-2 hours. Your doctor may order a device called an Incentive Spirometer to help you take deep breaths.  When coughing or sneezing, hold a pillow firmly against your incision with both hands. This is called "splinting." Doing this helps protect your incision. It also decreases belly discomfort.  If you are being admitted to the hospital overnight, leave your suitcase in the  car. After surgery it may be brought to your room.  In case of increased patient census, it may be necessary for you, the patient, to continue your postoperative care in the Same Day Surgery department.  If you are being discharged the day of surgery, you will not be allowed to drive home. You will need a responsible individual to drive you home and stay with you for 24 hours after surgery.   If you are taking public transportation, you will need to have a responsible individual with you.  Please call the Pre-admissions Testing Dept. at [phone] if you have any questions about these instructions.  Surgery Visitation Policy:  Patients having surgery or a procedure may have two visitors.  Children under the age of 35 must have an adult with them who is not the patient.  Inpatient Visitation:    Visiting hours are 7 a.m. to 8 p.m. Up to four visitors are allowed at one time in a patient room. The visitors may rotate out with other people during the day.  One visitor age 60 or older may stay with the patient overnight and must be in the room by 8 p.m.     Preparing for Surgery with CHLORHEXIDINE GLUCONATE (CHG) Soap  Chlorhexidine Gluconate (CHG) Soap  o An antiseptic cleaner that kills germs and bonds with the skin to continue killing germs even after washing  o Used for showering the night before surgery and morning of surgery  Before surgery, you can play an important role by reducing the number of germs on your skin.  CHG (Chlorhexidine gluconate) soap is an antiseptic cleanser which kills germs and bonds with the skin to continue killing germs even after washing.  Please do not use if you have an allergy to CHG or antibacterial soaps. If your skin becomes reddened/irritated stop using the CHG.  1. Shower the NIGHT BEFORE SURGERY and the MORNING OF SURGERY with CHG soap.  2. If you choose to wash your hair, wash your hair first as usual with your normal shampoo.  3.  After shampooing, rinse your hair and body thoroughly to remove the shampoo.  4. Use CHG as you would any other liquid soap. You can apply CHG directly to the skin and wash gently with a scrungie or a clean washcloth.  5. Apply the CHG soap to your body only from the neck down. Do not use on open wounds or open sores. Avoid contact with your eyes, ears, mouth, and genitals (private parts). Wash face and genitals (private parts) with your normal soap.  6. Wash thoroughly, paying special attention to the area where your surgery will be performed.  7. Thoroughly rinse your body with warm water .  8. Do not shower/wash with your normal soap after using and rinsing off the CHG soap.  9. Pat yourself dry with a clean towel.  10. Wear clean pajamas to bed the night before surgery.  12. Place clean sheets on your bed the night of your first shower and do not sleep with pets.  13. Shower again with  the CHG soap on the day of surgery prior to arriving at the hospital.  14. Do not apply any deodorants/lotions/powders.  15. Please wear clean clothes to the hospital.

## 2023-10-22 ENCOUNTER — Encounter
Admission: RE | Admit: 2023-10-22 | Discharge: 2023-10-22 | Disposition: A | Discharge status: Home / Self Care | Source: Ambulatory Visit | Attending: Surgery | Admitting: Surgery

## 2023-10-22 DIAGNOSIS — Z01810 Encounter for preprocedural cardiovascular examination: Secondary | ICD-10-CM | POA: Diagnosis present

## 2023-10-22 DIAGNOSIS — Z01818 Encounter for other preprocedural examination: Secondary | ICD-10-CM | POA: Diagnosis not present

## 2023-10-22 DIAGNOSIS — Z01812 Encounter for preprocedural laboratory examination: Secondary | ICD-10-CM | POA: Diagnosis present

## 2023-10-22 DIAGNOSIS — I10 Essential (primary) hypertension: Secondary | ICD-10-CM

## 2023-10-22 LAB — CBC
HCT: 44.5 % (ref 39.0–52.0)
Hemoglobin: 15.3 g/dL (ref 13.0–17.0)
MCH: 28.3 pg (ref 26.0–34.0)
MCHC: 34.4 g/dL (ref 30.0–36.0)
MCV: 82.3 fL (ref 80.0–100.0)
Platelets: 214 10*3/uL (ref 150–400)
RBC: 5.41 MIL/uL (ref 4.22–5.81)
RDW: 13 % (ref 11.5–15.5)
WBC: 5.7 10*3/uL (ref 4.0–10.5)
nRBC: 0 % (ref 0.0–0.2)

## 2023-10-22 LAB — BASIC METABOLIC PANEL WITH GFR
Anion gap: 12 (ref 5–15)
BUN: 16 mg/dL (ref 8–23)
CO2: 23 mmol/L (ref 22–32)
Calcium: 9 mg/dL (ref 8.9–10.3)
Chloride: 103 mmol/L (ref 98–111)
Creatinine, Ser: 0.81 mg/dL (ref 0.61–1.24)
GFR, Estimated: >60 mL/min (ref >60)
Glucose, Bld: 120 mg/dL — ABNORMAL HIGH (ref 70–99)
Potassium: 3.9 mmol/L (ref 3.5–5.1)
Sodium: 138 mmol/L (ref 135–145)

## 2023-10-27 ENCOUNTER — Encounter
Admission: RE | Disposition: A | Discharge status: Home / Self Care | Payer: Self-pay | Source: Ambulatory Visit | Attending: Surgery

## 2023-10-27 ENCOUNTER — Ambulatory Visit
Admission: RE | Admit: 2023-10-27 | Discharge: 2023-10-27 | Disposition: A | Discharge status: Home / Self Care | Source: Ambulatory Visit | Attending: Surgery | Admitting: Surgery

## 2023-10-27 ENCOUNTER — Other Ambulatory Visit: Payer: Self-pay

## 2023-10-27 ENCOUNTER — Ambulatory Visit: Admitting: Anesthesiology

## 2023-10-27 ENCOUNTER — Encounter: Payer: Self-pay | Admitting: Surgery

## 2023-10-27 ENCOUNTER — Ambulatory Visit: Payer: Self-pay | Admitting: Urgent Care

## 2023-10-27 DIAGNOSIS — K219 Gastro-esophageal reflux disease without esophagitis: Secondary | ICD-10-CM

## 2023-10-27 DIAGNOSIS — G5601 Carpal tunnel syndrome, right upper limb: Secondary | ICD-10-CM | POA: Diagnosis present

## 2023-10-27 DIAGNOSIS — Z79899 Other long term (current) drug therapy: Secondary | ICD-10-CM | POA: Insufficient documentation

## 2023-10-27 HISTORY — PX: CARPAL TUNNEL RELEASE: SHX101

## 2023-10-27 SURGERY — RELEASE, CARPAL TUNNEL, ENDOSCOPIC
Anesthesia: General | Site: Wrist | Laterality: Right

## 2023-10-27 MED ORDER — OMEPRAZOLE 20 MG PO CPDR: 20.0000 mg | DELAYED_RELEASE_CAPSULE | ORAL | Status: AC

## 2023-10-27 MED ORDER — LIDOCAINE HCL (PF) 2 % IJ SOLN
INTRAMUSCULAR | Status: AC
  Filled 2023-10-27: qty 5

## 2023-10-27 MED ORDER — FENTANYL CITRATE (PF) 100 MCG/2ML IJ SOLN: 25.0000 ug | INTRAMUSCULAR | Status: DC | PRN

## 2023-10-27 MED ORDER — LIDOCAINE HCL (CARDIAC) PF 100 MG/5ML IV SOSY
PREFILLED_SYRINGE | INTRAVENOUS | Status: DC | PRN
  Administered 2023-10-27: 80 mg via INTRAVENOUS

## 2023-10-27 MED ORDER — FENTANYL CITRATE (PF) 100 MCG/2ML IJ SOLN
INTRAMUSCULAR | Status: DC | PRN
  Administered 2023-10-27 (×2): 25 ug via INTRAVENOUS

## 2023-10-27 MED ORDER — FENTANYL CITRATE (PF) 100 MCG/2ML IJ SOLN
INTRAMUSCULAR | Status: AC
  Filled 2023-10-27: qty 2

## 2023-10-27 MED ORDER — LACTATED RINGERS IV SOLN: INTRAVENOUS | Status: DC

## 2023-10-27 MED ORDER — CEFAZOLIN SODIUM-DEXTROSE 2-4 GM/100ML-% IV SOLN
INTRAVENOUS | Status: AC
  Filled 2023-10-27: qty 100

## 2023-10-27 MED ORDER — ONDANSETRON HCL 4 MG/2ML IJ SOLN
INTRAMUSCULAR | Status: DC | PRN
  Administered 2023-10-27: 4 mg via INTRAVENOUS

## 2023-10-27 MED ORDER — MIDAZOLAM HCL 2 MG/2ML IJ SOLN
INTRAMUSCULAR | Status: AC
  Filled 2023-10-27: qty 2

## 2023-10-27 MED ORDER — MIDAZOLAM HCL 2 MG/2ML IJ SOLN
INTRAMUSCULAR | Status: DC | PRN
  Administered 2023-10-27: 2 mg via INTRAVENOUS

## 2023-10-27 MED ORDER — 0.9 % SODIUM CHLORIDE (POUR BTL) OPTIME
TOPICAL | Status: DC | PRN
  Administered 2023-10-27: 500 mL

## 2023-10-27 MED ORDER — ORAL CARE MOUTH RINSE: 15.0000 mL | Freq: Once | OROMUCOSAL | Status: AC

## 2023-10-27 MED ORDER — PROPOFOL 10 MG/ML IV BOLUS
INTRAVENOUS | Status: AC
  Filled 2023-10-27: qty 20

## 2023-10-27 MED ORDER — DEXAMETHASONE SODIUM PHOSPHATE 10 MG/ML IJ SOLN
INTRAMUSCULAR | Status: DC | PRN
  Administered 2023-10-27: 10 mg via INTRAVENOUS

## 2023-10-27 MED ORDER — OXYCODONE HCL 5 MG PO TABS: 5.0000 mg | ORAL_TABLET | Freq: Once | ORAL | Status: DC | PRN

## 2023-10-27 MED ORDER — BUPIVACAINE HCL (PF) 0.5 % IJ SOLN
INTRAMUSCULAR | Status: DC | PRN
  Administered 2023-10-27: 10 mL

## 2023-10-27 MED ORDER — CHLORHEXIDINE GLUCONATE 0.12 % MT SOLN
OROMUCOSAL | Status: AC
Start: 2023-10-27 — End: ?
  Filled 2023-10-27: qty 15

## 2023-10-27 MED ORDER — OXYCODONE HCL 5 MG/5ML PO SOLN: 5.0000 mg | Freq: Once | ORAL | Status: DC | PRN

## 2023-10-27 MED ORDER — ONDANSETRON HCL 4 MG/2ML IJ SOLN
INTRAMUSCULAR | Status: AC
  Filled 2023-10-27: qty 2

## 2023-10-27 MED ORDER — EPHEDRINE SULFATE-NACL 50-0.9 MG/10ML-% IV SOSY
PREFILLED_SYRINGE | INTRAVENOUS | Status: DC | PRN
  Administered 2023-10-27: 10 mg via INTRAVENOUS

## 2023-10-27 MED ORDER — ONDANSETRON HCL 4 MG/2ML IJ SOLN: 4.0000 mg | Freq: Once | INTRAMUSCULAR | Status: DC | PRN

## 2023-10-27 MED ORDER — ACETAMINOPHEN 10 MG/ML IV SOLN: 1000.0000 mg | Freq: Once | INTRAVENOUS | Status: DC | PRN

## 2023-10-27 MED ORDER — BUPIVACAINE HCL (PF) 0.5 % IJ SOLN
INTRAMUSCULAR | Status: AC
  Filled 2023-10-27: qty 30

## 2023-10-27 MED ORDER — KETOROLAC TROMETHAMINE 15 MG/ML IJ SOLN
INTRAMUSCULAR | Status: AC
  Filled 2023-10-27: qty 1

## 2023-10-27 MED ORDER — PROPOFOL 10 MG/ML IV BOLUS
INTRAVENOUS | Status: DC | PRN
  Administered 2023-10-27: 160 mg via INTRAVENOUS

## 2023-10-27 MED ORDER — EPHEDRINE 5 MG/ML INJ
INTRAVENOUS | Status: AC
Start: 2023-10-27 — End: ?
  Filled 2023-10-27: qty 5

## 2023-10-27 MED ORDER — KETOROLAC TROMETHAMINE 15 MG/ML IJ SOLN
15.0000 mg | Freq: Once | INTRAMUSCULAR | Status: AC
  Administered 2023-10-27: 15 mg via INTRAVENOUS

## 2023-10-27 MED ORDER — CEFAZOLIN SODIUM-DEXTROSE 2-4 GM/100ML-% IV SOLN
2.0000 g | INTRAVENOUS | Status: AC
  Administered 2023-10-27: 2 g via INTRAVENOUS

## 2023-10-27 MED ORDER — DEXAMETHASONE SODIUM PHOSPHATE 10 MG/ML IJ SOLN
INTRAMUSCULAR | Status: AC
  Filled 2023-10-27: qty 1

## 2023-10-27 MED ORDER — CHLORHEXIDINE GLUCONATE 0.12 % MT SOLN
15.0000 mL | Freq: Once | OROMUCOSAL | Status: AC
  Administered 2023-10-27: 15 mL via OROMUCOSAL

## 2023-10-27 SURGICAL SUPPLY — 32 items
BNDG COHESIVE 4X5 TAN STRL LF (GAUZE/BANDAGES/DRESSINGS) ×2 IMPLANT
BNDG ELASTIC 2INX 5YD STR LF (GAUZE/BANDAGES/DRESSINGS) ×2 IMPLANT
BNDG ELASTIC 2X5.8 VLCR NS LF (GAUZE/BANDAGES/DRESSINGS) IMPLANT
BNDG ESMARCH 4X12 STRL LF (GAUZE/BANDAGES/DRESSINGS) ×2 IMPLANT
CHLORAPREP W/TINT 26 (MISCELLANEOUS) ×2 IMPLANT
CORD BIP STRL DISP 12FT (MISCELLANEOUS) ×2 IMPLANT
CUFF TOURN SGL QUICK 18X4 (TOURNIQUET CUFF) ×2 IMPLANT
DRAPE SURG 17X11 SM STRL (DRAPES) ×2 IMPLANT
DRSG XEROFORM 1X8 (GAUZE/BANDAGES/DRESSINGS) IMPLANT
FORCEPS JEWEL BIP 4-3/4 STR (INSTRUMENTS) ×2 IMPLANT
GAUZE SPONGE 4X4 12PLY STRL (GAUZE/BANDAGES/DRESSINGS) ×2 IMPLANT
GAUZE XEROFORM 1X8 LF (GAUZE/BANDAGES/DRESSINGS) ×2 IMPLANT
GLOVE BIO SURGEON STRL SZ8 (GLOVE) ×2 IMPLANT
GLOVE INDICATOR 8.0 STRL GRN (GLOVE) ×2 IMPLANT
GOWN STRL REUS W/ TWL LRG LVL3 (GOWN DISPOSABLE) ×2 IMPLANT
GOWN STRL REUS W/ TWL XL LVL3 (GOWN DISPOSABLE) ×2 IMPLANT
KIT ESCP INSRT D SLOT CANN KN (MISCELLANEOUS) ×2 IMPLANT
KIT TURNOVER KIT A (KITS) ×2 IMPLANT
MANIFOLD NEPTUNE II (INSTRUMENTS) ×2 IMPLANT
NS IRRIG 500ML POUR BTL (IV SOLUTION) ×2 IMPLANT
PACK EXTREMITY ARMC (MISCELLANEOUS) ×2 IMPLANT
PENCIL SMOKE EVACUATOR (MISCELLANEOUS) ×2 IMPLANT
SPLINT WRIST LG LT TX990309 (SOFTGOODS) IMPLANT
SPLINT WRIST LG RT TX900304 (SOFTGOODS) IMPLANT
SPLINT WRIST M LT TX990308 (SOFTGOODS) IMPLANT
SPLINT WRIST M RT TX990303 (SOFTGOODS) IMPLANT
SPLINT WRIST XL LT TX990310 (SOFTGOODS) IMPLANT
SPLINT WRIST XL RT TX990305 (SOFTGOODS) IMPLANT
STOCKINETTE IMPERVIOUS 9X36 MD (GAUZE/BANDAGES/DRESSINGS) ×2 IMPLANT
SUT PROLENE 4 0 PS 2 18 (SUTURE) ×2 IMPLANT
TRAP FLUID SMOKE EVACUATOR (MISCELLANEOUS) ×2 IMPLANT
WATER STERILE IRR 500ML POUR (IV SOLUTION) IMPLANT

## 2023-10-27 NOTE — Anesthesia Postprocedure Evaluation (Signed)
 Anesthesia Post Note  Patient: [employer]  Procedure(s) Performed: RELEASE, CARPAL TUNNEL, ENDOSCOPIC (Right: Wrist)  Patient location during evaluation: PACU Anesthesia Type: General Level of consciousness: awake and alert Pain management: pain level controlled Vital Signs Assessment: post-procedure vital signs reviewed and stable Respiratory status: spontaneous breathing, nonlabored ventilation, respiratory function stable and patient connected to nasal cannula oxygen Cardiovascular status: blood pressure returned to baseline and stable Postop Assessment: no apparent nausea or vomiting Anesthetic complications: no   No notable events documented.   Last Vitals:  Vitals:   10/27/23 1213 10/27/23 1215  BP:  126/84  Pulse: 70 71  Resp: 18 18  Temp:    SpO2: 100% 100%    Last Pain:  Vitals:   10/27/23 1215  TempSrc:   PainSc: Asleep                 Lattie Poli

## 2023-10-27 NOTE — Anesthesia Procedure Notes (Signed)
 Procedure Name: LMA Insertion Date/Time: 10/27/2023 11:24 AM  Performed by: Orin Birk, CRNAPre-anesthesia Checklist: Patient identified, Emergency Drugs available, Suction available and Patient being monitored Patient Re-evaluated:Patient Re-evaluated prior to induction Oxygen Delivery Method: Circle system utilized Preoxygenation: Pre-oxygenation with 100% oxygen Induction Type: IV induction LMA: LMA inserted LMA Size: 4.0 Number of attempts: 1

## 2023-10-27 NOTE — Transfer of Care (Signed)
 Immediate Anesthesia Transfer of Care Note  Patient: [facility]  Procedure(s) Performed: RELEASE, CARPAL TUNNEL, ENDOSCOPIC (Right: Wrist)  Patient Location: PACU  Anesthesia Type:General  Level of Consciousness: drowsy and patient cooperative  Airway & Oxygen Therapy: Patient Spontanous Breathing and Patient connected to face mask oxygen  Post-op Assessment: Report given to RN and Post -op Vital signs reviewed and stable  Post vital signs: Reviewed and stable  Last Vitals:  Vitals Value Taken Time  BP 101/81 10/27/23 1203  Temp 36.1 C 10/27/23 1203  Pulse 69 10/27/23 1204  Resp 18 10/27/23 1204  SpO2 100 % 10/27/23 1204  Vitals shown include unfiled device data.  Last Pain:  Vitals:   10/27/23 1013  TempSrc: Temporal  PainSc: 7          Complications: No notable events documented.

## 2023-10-27 NOTE — Discharge Instructions (Addendum)
 Orthopedic discharge instructions: Keep dressing dry and intact. Keep hand elevated above heart level. May shower after dressing removed on postop day 4 (Sunday). Cover sutures with Band-Aids after drying off, then reapply Velcro splint. Apply ice to affected area frequently. Resume Mobic  15 mg daily OR take ibuprofen 600-800 mg TID with meals for 3-5 days, then as necessary. Take ES Tylenol  if needed between doses of ibuprofen.  Return for follow-up in 10-14 days or as scheduled.

## 2023-10-27 NOTE — Op Note (Signed)
 10/27/2023  12:05 PM  Patient:   Nathan Gill  Pre-Op Diagnosis:   Right carpal tunnel syndrome.  Post-Op Diagnosis:   Same.  Procedure:   Endoscopic right carpal tunnel release.  Surgeon:   Lonnie Roberts, MD  Anesthesia:   General LMA  Findings:   As above.  Complications:   None  EBL:   0 cc  Fluids:   50 cc crystalloid  TT:   13 minutes at 250 mmHg  Drains:   None  Closure:   4-0 Prolene interrupted sutures  Brief Clinical Note:   The patient is a 68 year old male with a history of progressively worsening pain and paresthesias to his right hand. The symptoms have progressed despite medications, activity modification, etc. His history and examination are consistent with carpal tunnel syndrome. The patient presents at this time for an endoscopic right carpal tunnel release.   Procedure:   The patient was brought into the operating room and lain in the supine position. After adequate general laryngeal mask anesthesia was obtained, the right hand and upper extremity were prepped with ChloraPrep solution before being draped sterilely. Preoperative antibiotics were administered. A timeout was performed to verify the appropriate surgical site before the limb was exsanguinated with an Esmarch and the tourniquet inflated to 250 mmHg.   An approximately 1.5-2 cm incision was made over the volar wrist flexion crease, centered over the palmaris longus tendon. The incision was carried down through the subcutaneous tissues with care taken to identify and protect any neurovascular structures. The distal forearm fascia was penetrated just proximal to the transverse carpal ligament. The soft tissues were released off the superficial and deep surfaces of the distal forearm fascia and this was released proximally for 3-4 cm under direct visualization.  Attention was directed distally. The Therapist, nutritional was passed beneath the transverse carpal ligament along the ulnar aspect of the carpal tunnel  and used to release any adhesions as well as to remove any adherent synovial tissue before first the smaller then the larger of the two dilators were passed beneath the transverse carpal ligament along the ulnar margin of the carpal tunnel. The slotted cannula was introduced and the endoscope was placed into the slotted cannula and the undersurface of the transverse carpal ligament visualized. The distal margin of the transverse carpal ligament was marked by placing a 25-gauge needle percutaneously at Kaplan's cardinal point so that it entered the distal portion of the slotted cannula. Under endoscopic visualization, the transverse carpal ligament was released from proximal to distal using the end-cutting blade. A second pass was performed to ensure complete release of the ligament. The adequacy of release was verified both endoscopically and by palpation using the freer elevator.  The wound was irrigated thoroughly with sterile saline solution before being closed using 4-0 Prolene interrupted sutures. A total of 10 cc of 0.5% plain Sensorcaine was injected in and around the incision before a sterile bulky dressing was applied to the wound. The patient was placed into a volar wrist splint before being awakened, extubated, and returned to the recovery room in satisfactory condition after tolerating the procedure well.

## 2023-10-27 NOTE — H&P (Signed)
 History of Present Illness:  Nathan Gill is a 68 y.o. male who presents for history and physical for an upcoming right endoscopic carpal tunnel release to be done by Dr. Daun Epstein on Oct 27, 2023. The patient saw Dr. Daun Epstein last month for evaluation and treatment of his chronic right hand and wrist pain and paresthesias. The patient notes that the symptoms have been present for many years but have been worsening over the past year or so prompting him to seek care. He saw Bert Britain, PA-C, in December, 2024. He suggested that the patient follow-up with me to discuss further treatment options. The patient notes that his symptoms are worse when he rides his mountain bike as he frequently has to stop to shake out his hand. He describes pain/paresthesias at night awakening him from sleep. In addition, his symptoms are aggravated by repetitive activities such as driving, holding a newspaper or magazine, and other similar activities. He takes Mobic  and Neurontin on a daily basis, as well as an occasional Tylenol  for discomfort with temporary partial relief of his symptoms. The patient is not a diabetic.  Current Outpatient Medications:  acetaminophen  (TYLENOL ) 325 MG tablet Take by mouth.  gabapentin (NEURONTIN) 300 MG capsule Take 1 capsule (300 mg total) by mouth at bedtime 90 capsule 2  meloxicam  (MOBIC ) 15 MG tablet Take 15 mg by mouth once daily  omeprazole  (PRILOSEC) 20 MG DR capsule Take 20 mg by mouth  traMADoL  (ULTRAM ) 50 mg tablet Take 1 tablet (50 mg total) by mouth every 6 (six) hours as needed for Pain for up to 30 doses 30 tablet 0  amLODIPine  (NORVASC ) 10 MG tablet Take by mouth.   Allergies:  Statins-Hmg-Coa Reductase Inhibitors (Muscle Pain, Joint Pain, Fatigue, Muscle Weakness)   Past Medical History:   GERD (gastroesophageal reflux disease)  Hypertension  Large cell lymphoma (CMS/HHS-HCC)  Osteoarthritis   Past Surgical History:  Ear surgery  KNEE ARTHROSCOPY  Removal left testisle    Family History:  Heart disease Mother Perfecto Bur  High blood pressure (Hypertension) Mother Allistair Longbrake  Hyperlipidemia (Elevated cholesterol) Mother Taelon Delrosario  Lung cancer Father Rohaan Bonawitz  Osteoarthritis Father Pollard Nou  Alcohol abuse Sister  Stroke Maternal Grandmother Neomi Banks  Alcohol abuse Maternal Uncle Maternal Uncles  Colon cancer Cousin Paternal Cousin   Social History:   Socioeconomic History:  Marital status: Married  Tobacco Use  Smoking status: Never  Smokeless tobacco: Never  Vaping Use  Vaping status: Never Used  Substance and Sexual Activity  Alcohol use: No  Drug use: No  Sexual activity: Not Currently  Partners: Female  Birth control/protection: None   Social Drivers of Health:   Physicist, medical Strain: Low Risk (10/18/2023)  Overall Financial Resource Strain (CARDIA)  Difficulty of Paying Living Expenses: Not hard at all  Food Insecurity: No Food Insecurity (10/18/2023)  Hunger Vital Sign  Worried About Running Out of Food in the Last Year: Never true  Ran Out of Food in the Last Year: Never true  Transportation Needs: No Transportation Needs (10/18/2023)  PRAPARE - Risk analyst (Medical): No  Lack of Transportation (Non-Medical): No   Review of Systems:  A comprehensive 14 point ROS was performed, reviewed, and the pertinent orthopaedic findings are documented in the HPI.  Physical Exam: Vitals:  10/18/23 0911  BP: 124/88  Weight: (!) 103.9 kg (229 lb)  Height: 180.3 cm (5\' 11" )  PainSc: 7  PainLoc: Wrist   General/Constitutional: The patient appears to  be well-nourished, well-developed, and in no acute distress. Neuro/Psych: Normal mood and affect, oriented to person, place and time. Eyes: Non-icteric. Pupils are equal, round, and reactive to light, and exhibit synchronous movement. ENT: Unremarkable. Lymphatic: No palpable adenopathy. Respiratory: Lungs clear to auscultation, Normal chest excursion, No  wheezes, and Non-labored breathing Cardiovascular: Regular rate and rhythm. No murmurs. and No edema, swelling or tenderness, except as noted in detailed exam. Integumentary: No impressive skin lesions present, except as noted in detailed exam. Musculoskeletal: Unremarkable, except as noted in detailed exam.  Right wrist/hand exam: Skin inspection of the right wrist and hand is notable for mild swelling over the dorsal aspect of the wrist, but otherwise is unremarkable. No erythema, ecchymosis, abrasions, or other skin abnormalities are identified. He exhibits full active and passive range of motion of the wrist without any pain. He is able to active flex extend all digits fully without any pain or triggering. He is neurovascularly intact to all digits. He has an negative Phalen's test and a negative Tinel's over the carpal tunnel.  X-rays/MRI/Lab data:  Recent AP, lateral, and oblique views of the right wrist/hand are available for review and have been reviewed by myself. These films demonstrate advanced radial scaphoid degenerative changes which appear to be secondary to a chronic scapholunate dissociation. No fractures or other acute bony processes are identified.  Assessment: 1. Carpal tunnel syndrome, right.  2. Obesity (BMI 30.0-34.9).   Plan: The treatment options were discussed with the patient. In addition, patient educational materials were provided regarding the diagnosis and treatment options. The patient is quite frustrated by his symptoms and function limitations, and is ready to consider more aggressive treatment options. Therefore, I have recommended a surgical procedure, specifically an endoscopic right carpal tunnel release. The procedure was discussed with the patient, as were the potential risks (including bleeding, infection, nerve and/or blood vessel injury, persistent or recurrent pain/paresthesias, weakness of grip, stiffness of his fingers, need for further surgery, blood  clots, strokes, heart attacks and/or arhythmias, pneumonia, etc.) and benefits. The patient states his/her understanding and wishes to proceed. All of the patient's questions and concerns were answered. He can call any time with further concerns. He will follow up post-surgery, routine.    H&P reviewed and patient re-examined. No changes.

## 2023-10-27 NOTE — Anesthesia Preprocedure Evaluation (Signed)
 Anesthesia Evaluation  Patient identified by MRN, date of birth, ID band Patient awake    Reviewed: Allergy & Precautions, NPO status , Patient's Chart, lab work & pertinent test results  History of Anesthesia Complications Negative for: history of anesthetic complications  Airway Mallampati: III  TM Distance: >3 FB Neck ROM: Full    Dental no notable dental hx. (+) Teeth Intact   Pulmonary neg pulmonary ROS, neg sleep apnea, neg COPD, Patient abstained from smoking.Not current smoker   Pulmonary exam normal breath sounds clear to auscultation       Cardiovascular Exercise Tolerance: Good METShypertension, Pt. on medications (-) CAD and (-) Past MI (-) dysrhythmias  Rhythm:Regular Rate:Normal - Systolic murmurs    Neuro/Psych negative neurological ROS  negative psych ROS   GI/Hepatic ,GERD  Controlled and Medicated,,(+)     (-) substance abuse    Endo/Other  neg diabetes    Renal/GU negative Renal ROS     Musculoskeletal  (+) Arthritis ,    Abdominal   Peds  Hematology   Anesthesia Other Findings Past Medical History: No date: Arthritis     Comment:  Elbows, wrists, knees No date: Cancer (HCC) No date: Carpal tunnel syndrome, right No date: GERD (gastroesophageal reflux disease) No date: History of kidney stones No date: Hypertension 06/23/2011: Lymphoma (HCC)     Comment:  chemo/radiation treatment No date: Pneumonia No date: Pre-diabetes No date: Wears hearing aid in left ear  Reproductive/Obstetrics                             Anesthesia Physical Anesthesia Plan  ASA: 2  Anesthesia Plan: General   Post-op Pain Management: Ofirmev  IV (intra-op)* and Toradol IV (intra-op)*   Induction: Intravenous  PONV Risk Score and Plan: 2 and Ondansetron , Dexamethasone and Midazolam  Airway Management Planned: LMA  Additional Equipment: None  Intra-op Plan:   Post-operative  Plan: Extubation in OR  Informed Consent: I have reviewed the patients History and Physical, chart, labs and discussed the procedure including the risks, benefits and alternatives for the proposed anesthesia with the patient or authorized representative who has indicated his/her understanding and acceptance.     Dental advisory given  Plan Discussed with: CRNA and Surgeon  Anesthesia Plan Comments: (Discussed risks of anesthesia with patient, including PONV, sore throat, lip/dental/eye damage. Rare risks discussed as well, such as cardiorespiratory and neurological sequelae, and allergic reactions. Discussed the role of CRNA in patient's perioperative care. Patient understands.)       Anesthesia Quick Evaluation

## 2023-10-28 ENCOUNTER — Encounter: Payer: Self-pay | Admitting: Surgery

## 2024-03-21 ENCOUNTER — Other Ambulatory Visit: Payer: Self-pay | Admitting: Orthopedic Surgery

## 2024-03-21 DIAGNOSIS — M25312 Other instability, left shoulder: Secondary | ICD-10-CM

## 2024-03-21 DIAGNOSIS — S46002A Unspecified injury of muscle(s) and tendon(s) of the rotator cuff of left shoulder, initial encounter: Secondary | ICD-10-CM

## 2024-03-21 DIAGNOSIS — M25512 Pain in left shoulder: Secondary | ICD-10-CM

## 2024-03-28 ENCOUNTER — Ambulatory Visit
Admission: RE | Admit: 2024-03-28 | Discharge: 2024-03-28 | Disposition: A | Discharge status: Home / Self Care | Source: Ambulatory Visit | Attending: Orthopedic Surgery | Admitting: Orthopedic Surgery

## 2024-03-28 DIAGNOSIS — M25512 Pain in left shoulder: Secondary | ICD-10-CM

## 2024-03-28 DIAGNOSIS — S46002A Unspecified injury of muscle(s) and tendon(s) of the rotator cuff of left shoulder, initial encounter: Secondary | ICD-10-CM

## 2024-03-28 DIAGNOSIS — M25312 Other instability, left shoulder: Secondary | ICD-10-CM
